# Patient Record
Sex: Female | Born: 1985 | Hispanic: Yes | Marital: Married | Smoking: Never smoker
Health system: Southern US, Community
[De-identification: ages and names within clinical notes are randomized; demographics above are authoritative.]

## PROBLEM LIST (undated history)

## (undated) DIAGNOSIS — K219 Gastro-esophageal reflux disease without esophagitis: Secondary | ICD-10-CM

## (undated) DIAGNOSIS — J479 Bronchiectasis, uncomplicated: Secondary | ICD-10-CM

## (undated) DIAGNOSIS — J45909 Unspecified asthma, uncomplicated: Secondary | ICD-10-CM

## (undated) HISTORY — DX: Bronchiectasis, uncomplicated: J47.9

## (undated) HISTORY — DX: Unspecified asthma, uncomplicated: J45.909

---
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
## 2012-05-24 HISTORY — PX: NASAL SINUS SURGERY: SHX719

## 2013-09-21 HISTORY — PX: CHOLECYSTECTOMY: SHX55

## 2020-11-21 HISTORY — PX: WISDOM TOOTH EXTRACTION: SHX21

## 2021-03-02 ENCOUNTER — Ambulatory Visit: Payer: 59 | Admitting: Family Medicine

## 2021-03-02 ENCOUNTER — Encounter: Payer: Self-pay | Admitting: Family Medicine

## 2021-03-02 ENCOUNTER — Other Ambulatory Visit: Payer: Self-pay

## 2021-03-02 VITALS — BP 92/62 | HR 82 | Temp 98.1°F | Ht 63.0 in | Wt 145.0 lb

## 2021-03-02 DIAGNOSIS — R21 Rash and other nonspecific skin eruption: Secondary | ICD-10-CM

## 2021-03-02 DIAGNOSIS — J309 Allergic rhinitis, unspecified: Secondary | ICD-10-CM

## 2021-03-02 DIAGNOSIS — J479 Bronchiectasis, uncomplicated: Secondary | ICD-10-CM | POA: Diagnosis not present

## 2021-03-02 DIAGNOSIS — N644 Mastodynia: Secondary | ICD-10-CM | POA: Diagnosis not present

## 2021-03-02 DIAGNOSIS — K5900 Constipation, unspecified: Secondary | ICD-10-CM

## 2021-03-02 MED ORDER — ALBUTEROL SULFATE HFA 108 (90 BASE) MCG/ACT IN AERS: 1.0000 | INHALATION_SPRAY | Freq: Four times a day (QID) | RESPIRATORY_TRACT | 1 refills | Status: DC | PRN

## 2021-03-02 MED ORDER — BUDESONIDE-FORMOTEROL FUMARATE 160-4.5 MCG/ACT IN AERO: 2.0000 | INHALATION_SPRAY | Freq: Two times a day (BID) | RESPIRATORY_TRACT | 1 refills | Status: DC

## 2021-03-02 NOTE — Assessment & Plan Note (Signed)
Ongoing left lateral breast pain, sharp/stinging character, denies any skin color changes including redness, no skin texture changes, no nipple discharge.  States that sensation is similar to what she describes as mastitis when she was nursing in the past.  Physical examination reveals symmetric breasts, nipples, no skin discoloration, abnormal texture, there is discrete and focal tenderness along the fourth rib laterally without crepitations, no overlying masses appreciated, no axillary lymphadenopathy, no nipple discharge expressed, contralateral breast is nontender and without masses.  Patient with left described breast pain in the setting of recent progression of chronic cough, no discrete masses, concern for musculoskeletal etiology among other differential entities.  I have advised a short course of ibuprofen 600 mg daily x10 days to assess for response, I have also placed a referral to gynecology for further evaluation and management.

## 2021-03-02 NOTE — Assessment & Plan Note (Signed)
Patient with noted rash at the forehead, chin, cheeks

## 2021-03-02 NOTE — Patient Instructions (Addendum)
-   Continue current inhaler medications - Dose ibuprofen 600 mg once daily with breakfast/lunch x10 days and evaluate breast pain - Start intranasal steroid Flonase (fluticasone propionate) - Over-the-counter antihistamine (Claritin, Zyrtec, etc.) - Increase hydration (water intake) - Review information provided  - Referral coordinator will contact you for follow-up with OB/GYN, dermatology, pulmonology

## 2021-03-03 ENCOUNTER — Telehealth: Payer: Self-pay

## 2021-03-03 NOTE — Telephone Encounter (Signed)
[facility] referring for establish care, left lateral breast pain and tenderness, no masses appreciated. Called and left voicemail for patient to call back to be scheduled.

## 2021-03-04 NOTE — Telephone Encounter (Addendum)
Called and left voicemail for patient to call back to be scheduled. X2  

## 2021-03-05 NOTE — Progress Notes (Signed)
Primary Care / Sports Medicine Office Visit  Patient Information:  Patient ID: Jeanette Sandoval, female DOB: Sep 17, 1985 Age: 35 y.o. MRN: [id number]   Jeanette Sandoval is a pleasant 35 y.o. female presenting with the following:  Chief Complaint  Patient presents with   New Patient (Initial Visit)   Establish Care    Moved from [place] over a year ago    Review of Systems pertinent details above   Patient Active Problem List   Diagnosis Date Noted   Malar rash 03/02/2021   Bronchiectasis without complication (HCC) 03/02/2021   Allergic rhinitis 03/02/2021   Breast pain, left 03/02/2021   Past Medical History:  Diagnosis Date   Asthma    Bronchiectasis [facility])    Outpatient Encounter Medications as of 03/02/2021  Medication Sig   [DISCONTINUED] albuterol (VENTOLIN HFA) 108 (90 Base) MCG/ACT inhaler Inhale 1 puff into the lungs every 6 (six) hours as needed for wheezing or shortness of breath.   [DISCONTINUED] budesonide-formoterol (SYMBICORT) 160-4.5 MCG/ACT inhaler Inhale 2 puffs into the lungs 2 (two) times daily.   albuterol (VENTOLIN HFA) 108 (90 Base) MCG/ACT inhaler Inhale 1 puff into the lungs every 6 (six) hours as needed for wheezing or shortness of breath.   budesonide-formoterol (SYMBICORT) 160-4.5 MCG/ACT inhaler Inhale 2 puffs into the lungs 2 (two) times daily.   No facility-administered encounter medications on file as of 03/02/2021.   Past Surgical History:  Procedure Laterality Date   APPENDECTOMY  1999   CESAREAN SECTION  2019   NASAL SINUS SURGERY  2014   WISDOM TOOTH EXTRACTION Bilateral 11/2020    Vitals:   03/02/21 1616  BP: 92/62  Pulse: 82  Temp: 98.1 F (36.7 C)  SpO2: 96%   Vitals:   03/02/21 1616  Weight: 145 lb (65.8 kg)  Height: 5\' 3"  (1.6 m)   Body mass index is 25.69 kg/m.  No results found.   Independent interpretation of notes and tests performed by another provider:   None  Procedures performed:   None  Pertinent  History, Exam, Impression, and Recommendations:   Breast pain, left Ongoing left lateral breast pain, sharp/stinging character, denies any skin color changes including redness, no skin texture changes, no nipple discharge.  States that sensation is similar to what she describes as mastitis when she was nursing in the past.  Physical examination reveals symmetric breasts, nipples, no skin discoloration, abnormal texture, there is discrete and focal tenderness along the fourth rib laterally without crepitations, no overlying masses appreciated, no axillary lymphadenopathy, no nipple discharge expressed, contralateral breast is nontender and without masses.  Patient with left described breast pain in the setting of recent progression of chronic cough, no discrete masses, concern for musculoskeletal etiology among other differential entities.  I have advised a short course of ibuprofen 600 mg daily x10 days to assess for response, I have also placed a referral to gynecology for further evaluation and management.  Malar rash Patient with noted rash at the forehead, chin, cheeks  Allergic rhinitis Turbinate erythema and swelling noted in the setting of presenting concern over hacking productive cough while lung fields are clear to auscultation throughout without wheezes, rales, rhonchi.  I discussed the concern over postnasal drainage contributing to her stated symptoms and I have advised intranasal steroid, OTC antihistamine.  Bronchiectasis without complication (HCC) Stated history of the same, she has requested pulmonology referral as [place]  She states regular Symbicort and near daily  albuterol usage for bronchiectasis.  Lung examination reveals clear lung fields throughout without wheezes, rales, rhonchi.  Concern over frequency of albuterol usage in the setting of examination findings, referral placed to pulmonology, we will follow peripherally.    Orders & Medications Meds ordered this encounter  Medications   albuterol (VENTOLIN HFA) 108 (90 Base) MCG/ACT inhaler    Sig: Inhale 1 puff into the lungs every 6 (six) hours as needed for wheezing or shortness of breath.    Dispense:  8 g    Refill:  1   budesonide-formoterol (SYMBICORT) 160-4.5 MCG/ACT inhaler    Sig: Inhale 2 puffs into the lungs 2 (two) times daily.    Dispense:  10 g    Refill:  1   Orders Placed This Encounter  Procedures   TSH Rfx on Abnormal to Free T4   ANA 12 Plus Profile (RDL)   CBC with Differential/Platelet   Comprehensive metabolic panel   Ambulatory referral to Gynecology   Ambulatory referral to Pulmonology   Ambulatory referral to Dermatology     Return in about 2 months (around 05/02/2021).     Jerrol Banana, MD   Primary Care Sports Medicine [facility]

## 2021-03-05 NOTE — Assessment & Plan Note (Signed)
Turbinate erythema and swelling noted in the setting of presenting concern over hacking productive cough while lung fields are clear to auscultation throughout without wheezes, rales, rhonchi.  I discussed the concern over postnasal drainage contributing to her stated symptoms and I have advised intranasal steroid, OTC antihistamine.

## 2021-03-05 NOTE — Assessment & Plan Note (Signed)
Stated history of the same, she has requested pulmonology referral as she had previously been [place] one [place].  She states regular Symbicort and near daily albuterol usage for bronchiectasis.  Lung examination reveals clear lung fields throughout without wheezes, rales, rhonchi.  Concern over frequency of albuterol usage in the setting of examination findings, referral placed to pulmonology, we will follow peripherally.

## 2021-03-06 NOTE — Telephone Encounter (Signed)
Patient is scheduled for 03/25/21 with JEG

## 2021-03-19 ENCOUNTER — Encounter: Payer: Self-pay | Admitting: Family Medicine

## 2021-03-25 ENCOUNTER — Ambulatory Visit (INDEPENDENT_AMBULATORY_CARE_PROVIDER_SITE_OTHER): Payer: 59 | Admitting: Advanced Practice Midwife

## 2021-03-25 ENCOUNTER — Encounter: Payer: Self-pay | Admitting: Advanced Practice Midwife

## 2021-03-25 ENCOUNTER — Other Ambulatory Visit: Payer: Self-pay

## 2021-03-25 VITALS — BP 105/67 | Ht 63.0 in | Wt 144.0 lb

## 2021-03-25 DIAGNOSIS — N6325 Unspecified lump in the left breast, overlapping quadrants: Secondary | ICD-10-CM

## 2021-03-25 DIAGNOSIS — N644 Mastodynia: Secondary | ICD-10-CM | POA: Diagnosis not present

## 2021-03-30 ENCOUNTER — Encounter: Payer: Self-pay | Admitting: Advanced Practice Midwife

## 2021-03-30 NOTE — Progress Notes (Signed)
Patient ID: Jeanette Sandoval, female   DOB: 1985/12/23, 35 y.o.   MRN: [id number]  Reason for Consult: Breast Problem (Burning sensation Lt breast not really pain, can't really explain. RM 3)   Referred by Jerrol Banana, MD  Subjective:  Date of Service: 03/25/2021  HPI:  Jeanette Sandoval is a 35 y.o. female being seen on referral for pain in her left breast. The pain began about 2 months ago and is in approximately the same place as a clogged duct that she had when breastfeeding about a year ago. She stopped breastfeeding in January of this year. She has the pain most days. It comes and goes and can last for hours at a time. It is burning and stinging. She denies redness, heat, lump or hard area. She has not done anything that makes the pain better or worse. She has irregular periods from every 20 to 60 days of moderate flow. She uses condoms for birth control.   Past Medical History:  Diagnosis Date   Asthma    Bronchiectasis (HCC)    Family History  Problem Relation Age of Onset   Lupus Mother    Stomach cancer Maternal Aunt    Ovarian cancer Maternal Aunt    Coronary artery disease Maternal Grandmother    Peripheral Artery Disease Maternal Grandmother    Liver cancer Paternal Grandmother    Past Surgical History:  Procedure Laterality Date   APPENDECTOMY  1999   CESAREAN SECTION  2019   NASAL SINUS SURGERY  2014   WISDOM TOOTH EXTRACTION Bilateral 11/2020    Short Social History:  Social History   Tobacco Use   Smoking status: Never   Smokeless tobacco: Never  Substance Use Topics   Alcohol use: Not Currently    No Known Allergies  Current Outpatient Medications  Medication Sig Dispense Refill   albuterol (VENTOLIN HFA) 108 (90 Base) MCG/ACT inhaler Inhale 1 puff into the lungs every 6 (six) hours as needed for wheezing or shortness of breath. 8 g 1   budesonide-formoterol (SYMBICORT) 160-4.5 MCG/ACT inhaler Inhale 2 puffs into the lungs 2 (two) times daily. 10  g 1   doxycycline (VIBRAMYCIN) 100 MG capsule Take 100 mg by mouth 2 (two) times daily.     No current facility-administered medications for this visit.    Review of Systems  Constitutional:  Negative for chills and fever.  HENT:  Negative for congestion, ear discharge, ear pain, hearing loss, sinus pain and sore throat.   Eyes:  Negative for blurred vision and double vision.  Respiratory:  Negative for cough, shortness of breath and wheezing.   Cardiovascular:  Negative for chest pain, palpitations and leg swelling.  Gastrointestinal:  Negative for abdominal pain, blood in stool, constipation, diarrhea, heartburn, melena, nausea and vomiting.  Genitourinary:  Negative for dysuria, flank pain, frequency, hematuria and urgency.  Musculoskeletal:  Negative for back pain, joint pain and myalgias.  Skin:  Negative for itching and rash.  Neurological:  Negative for dizziness, tingling, tremors, sensory change, speech change, focal weakness, seizures, loss of consciousness, weakness and headaches.  Endo/Heme/Allergies:  Negative for environmental allergies. Does not bruise/bleed easily.  Psychiatric/Behavioral:  Negative for depression, hallucinations, memory loss, substance abuse and suicidal ideas. The patient is not nervous/anxious and does not have insomnia.   Breast: positive for painful area     Objective:  Objective   Vitals:   03/25/21 1536  BP: 105/67  Weight: 144 lb (65.3 kg)  Height: 5\' 3"  (1.6  m)   Body mass index is 25.51 kg/m. Constitutional: Well nourished, well developed female in no acute distress.  HEENT: normal Skin: Warm and dry.  Cardiovascular: Regular rate and rhythm.   Extremity:  no edema   Respiratory: Clear to auscultation bilateral. Normal respiratory effort Breast: breasts symmetrical, right breast non-tender, no masses or lumps, left breast with focal area of tenderness at 3 o'clock approximately 3 cm from nipple with ropey lump that could be fibrocystic  tissue Neuro: DTRs 2+, Cranial nerves grossly intact Psych: Alert and Oriented x3. No memory deficits. Normal mood and affect.     Assessment/Plan:     35 y.o. G1 P56 female with painful left breast lump normal breast tissue vs other type of mass  Diagnostic mammogram and ultrasound ordered  Follow up as needed after imaging   [facility] 03/30/2021, 9:04 AM

## 2021-03-31 ENCOUNTER — Ambulatory Visit: Payer: 59 | Admitting: Internal Medicine

## 2021-03-31 ENCOUNTER — Other Ambulatory Visit: Payer: Self-pay

## 2021-03-31 ENCOUNTER — Encounter: Payer: Self-pay | Admitting: Internal Medicine

## 2021-03-31 ENCOUNTER — Other Ambulatory Visit
Admission: RE | Admit: 2021-03-31 | Discharge: 2021-03-31 | Disposition: A | Discharge status: Home / Self Care | Payer: 59 | Source: Home / Self Care | Attending: Internal Medicine | Admitting: Internal Medicine

## 2021-03-31 ENCOUNTER — Ambulatory Visit
Admission: RE | Admit: 2021-03-31 | Discharge: 2021-03-31 | Disposition: A | Discharge status: Home / Self Care | Payer: 59 | Source: Ambulatory Visit | Attending: Internal Medicine | Admitting: Internal Medicine

## 2021-03-31 ENCOUNTER — Ambulatory Visit
Admission: RE | Admit: 2021-03-31 | Discharge: 2021-03-31 | Disposition: A | Discharge status: Home / Self Care | Payer: 59 | Attending: Internal Medicine | Admitting: Internal Medicine

## 2021-03-31 DIAGNOSIS — J479 Bronchiectasis, uncomplicated: Secondary | ICD-10-CM

## 2021-03-31 MED ORDER — BUDESONIDE-FORMOTEROL FUMARATE 80-4.5 MCG/ACT IN AERO
INHALATION_SPRAY | RESPIRATORY_TRACT | 12 refills | Status: DC
Start: 2021-03-31 — End: 2021-05-11

## 2021-03-31 MED ORDER — PANTOPRAZOLE SODIUM 40 MG PO TBEC: 40.0000 mg | DELAYED_RELEASE_TABLET | Freq: Every day | ORAL | 2 refills | Status: DC

## 2021-03-31 MED ORDER — FAMOTIDINE 20 MG PO TABS: ORAL_TABLET | ORAL | 11 refills | Status: DC

## 2021-03-31 NOTE — Patient Instructions (Addendum)
Pantoprazole (protonix) 40 mg   Take  30-60 min before first meal of the day and Pepcid (famotidine)  20 mg after supper until return to office - this is the best way to tell whether stomach acid is contributing to your problem.    GERD (REFLUX)  is an extremely common cause of respiratory symptoms just like yours , many times with no obvious heartburn at all.    It can be treated with medication, but also with lifestyle changes including elevation of the head of your bed (ideally with 6 -8inch blocks under the headboard of your bed),  Smoking cessation, avoidance of late meals, excessive alcohol, and avoid fatty foods, chocolate, peppermint, colas, red wine, and acidic juices such as orange juice.  NO MINT OR MENTHOL PRODUCTS SO NO COUGH DROPS  USE SUGARLESS CANDY INSTEAD (Jolley ranchers or Stover's or Life Savers) or even ice chips will also do - the key is to swallow to prevent all throat clearing. NO OIL BASED VITAMINS - use powdered substitutes.  Avoid fish oil when coughing.   Plan A = Automatic = Always=    Symbicort 80 Take 2 puffs first thing in am and then another 2 puffs about 12 hours later.   Work on inhaler technique:  relax and gently blow all the way out then take a nice smooth full deep breath back in, triggering the inhaler at same time you start breathing in.  Hold for up to 5 seconds if you can. Blow out thru nose. Rinse and gargle with water when done.  If mouth or throat bother you at all,  try brushing teeth/gums/tongue with arm and hammer toothpaste/ make a slurry and gargle and spit out.      Plan B = Backup (to supplement plan A, not to replace it) Only use your albuterol inhaler as a rescue medication to be used if you can't catch your breath by resting or doing a relaxed purse lip breathing pattern.  - The less you use it, the better it will work when you need it. - Ok to use the inhaler up to 2 puffs  every 4 hours if you must but call for appointment if use goes up  over your usual need - Don't leave home without it !!  (think of it like the spare tire for your car)     For cough > mucinex dm 1200 mg every 12 hours every 12 hoursas needed    Please remember to go to the lab and x-ray department  for your tests - we will call you with the results when they are available.        Please schedule a follow up office visit in 4 weeks, sooner if needed with pfts

## 2021-03-31 NOTE — Progress Notes (Signed)
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   Sleeping as above without nocturnal  or early am exacerbation  of respiratory  c/o's or need for noct saba. Also denies any obvious fluctuation of symptoms with weather or environmental changes or other aggravating or alleviating factors except as outlined above   No unusual exposure hx or h/o childhood pna/ asthma or knowledge of premature birth.  Current Allergies, Complete Past Medical History, Past Surgical History, Family History, and Social History were reviewed in [employer] record.  ROS  The following are not active complaints unless bolded Hoarseness, sore throat, dysphagia, dental problems, itching, sneezing,  nasal congestion or discharge of excess mucus or purulent secretions, ear ache,   fever, chills, sweats, unintended wt loss or wt gain, classically pleuritic or exertional cp,  orthopnea pnd or arm/hand swelling  or leg swelling, presyncope, palpitations, abdominal pain, anorexia, nausea, vomiting, diarrhea  or change in bowel habits or change in bladder habits, change in stools or change in urine,  dysuria, hematuria,  rash, arthralgias, visual complaints, headache, numbness, weakness or ataxia or problems with walking or coordination,  change in mood or  memory.           Past Medical History:  Diagnosis Date   Asthma    Bronchiectasis ([place])     Outpatient Medications Prior to Visit  Medication Sig Dispense Refill   albuterol (VENTOLIN HFA) 108 (90 Base) MCG/ACT inhaler Inhale 1 puff into the lungs every 6 (six) hours as needed for wheezing or shortness of breath. 8 g 1   budesonide-formoterol (SYMBICORT) 160-4.5 MCG/ACT inhaler Inhale 2 puffs into the lungs 2 (two) times daily. 10 g 1   doxycycline (VIBRAMYCIN) 100 MG capsule Take 100 mg by mouth 2 (two) times daily.     No facility-administered medications prior to visit.     Objective:     BP 120/80 (BP Location: Left Arm, Patient Position: Sitting, Cuff Size: Normal)   Pulse 72   Temp (!) 97.3 F (36.3 C) (Oral)   Ht 5' 5.35" (1.66 m)   Wt 147 lb 9.6 oz (67 kg)   LMP 03/12/2021   SpO2 98%   BMI 24.30 kg/m   SpO2: 98 %  Amb healthy appearing latina /nad    HEENT : Oropharynx  clear   Nasal turbinates nl    NECK :  without  apparent JVD/ palpable Nodes/TM    LUNGS: no acc muscle use,  Nl contour chest with pan exp rhonchi  bilaterally without cough on insp or exp maneuvers   CV:  RRR  no s3 or murmur or increase in P2, and no edema   ABD:  soft and nontender with nl inspiratory excursion in the supine position. No bruits or organomegaly appreciated   MS:  Nl gait/ ext warm without deformities Or obvious joint restrictions  calf tenderness, cyanosis or clubbing    SKIN: warm and dry without lesions    NEURO:  alert, approp, nl sensorium with  no motor or cerebellar deficits apparent.    CXR PA and Lateral:   03/31/2021 :    I personally reviewed images and agree with radiology impression as follows:    1. Patchy airspace consolidation within the right middle lobe compatible with pneumonia.  Radiographic follow-up after appropriate treatment is recommended to document resolution. 2. Rounded density projecting over the left lung base may reflect a nipple shadow. Attention on follow-up.     Assessment   Bronchiectasis without complication ([place]) 67/54/49  allergy profile Eos 0.2 / IgE 24   alpha one AT phenotype  MM  Level 143  - 04/07/21 Quant Ig's >>> not done  - 04/07/21 Quant Tb  neg  - 04/07/21  ESR  =  32   She will need longitudinal care as well as extensive records from [place] in [place] so I have referred her to Dr Patsey Berthold in our office here to complete the w/u  - in meantime add empirical gerd rx and f/u with HRCT planned       Jeanette Gully, MD 03/31/2021

## 2021-04-01 ENCOUNTER — Telehealth: Payer: Self-pay | Admitting: Internal Medicine

## 2021-04-01 NOTE — Telephone Encounter (Signed)
I spoke with [facility] and let her know that we did receive the report for the CXR. Nothing further needed.

## 2021-04-07 ENCOUNTER — Telehealth: Payer: Self-pay | Admitting: Internal Medicine

## 2021-04-07 ENCOUNTER — Other Ambulatory Visit
Admission: RE | Admit: 2021-04-07 | Discharge: 2021-04-07 | Disposition: A | Discharge status: Home / Self Care | Payer: 59 | Attending: Internal Medicine | Admitting: Internal Medicine

## 2021-04-07 DIAGNOSIS — J479 Bronchiectasis, uncomplicated: Secondary | ICD-10-CM | POA: Insufficient documentation

## 2021-04-07 LAB — SEDIMENTATION RATE: Sed Rate: 32 mm/hr — ABNORMAL HIGH (ref 0–20)

## 2021-04-07 LAB — CBC WITH DIFFERENTIAL/PLATELET
Abs Immature Granulocytes: 0.04 10*3/uL (ref 0.00–0.07)
Basophils Absolute: 0 10*3/uL (ref 0.0–0.1)
Basophils Relative: 0 %
Eosinophils Absolute: 0.2 10*3/uL (ref 0.0–0.5)
Eosinophils Relative: 2 %
HCT: 39.7 % (ref 36.0–46.0)
Hemoglobin: 13.1 g/dL (ref 12.0–15.0)
Immature Granulocytes: 0 %
Lymphocytes Relative: 19 %
Lymphs Abs: 2.1 10*3/uL (ref 0.7–4.0)
MCH: 29.2 pg (ref 26.0–34.0)
MCHC: 33 g/dL (ref 30.0–36.0)
MCV: 88.4 fL (ref 80.0–100.0)
Monocytes Absolute: 0.6 10*3/uL (ref 0.1–1.0)
Monocytes Relative: 6 %
Neutro Abs: 8 10*3/uL — ABNORMAL HIGH (ref 1.7–7.7)
Neutrophils Relative %: 73 %
Platelets: 275 10*3/uL (ref 150–400)
RBC: 4.49 MIL/uL (ref 3.87–5.11)
RDW: 14 % (ref 11.5–15.5)
WBC: 10.9 10*3/uL — ABNORMAL HIGH (ref 4.0–10.5)
nRBC: 0 % (ref 0.0–0.2)

## 2021-04-07 LAB — TSH: TSH: 1.068 u[IU]/mL (ref 0.350–4.500)

## 2021-04-07 NOTE — Telephone Encounter (Signed)
Dr. Sherene Sires please advise on patient mychart message. Thank you    I don't know if this is any help. This is the results I got January 2021. They are in Spanish, though.   SEE ATTACHMENT

## 2021-04-07 NOTE — Telephone Encounter (Signed)
This is the reason I referred her to Dr Jayme Cloud  - this is nothing urgent but the doctor who assumes her care will have to get familiar with her prior w/u which is all in spanish and this can wait until Dr Reece Agar is available for outpt consultation unless new significant symptoms in meantime in which case we can have her seen more urgently

## 2021-04-08 ENCOUNTER — Other Ambulatory Visit: Payer: Self-pay

## 2021-04-08 DIAGNOSIS — J849 Interstitial pulmonary disease, unspecified: Secondary | ICD-10-CM

## 2021-04-08 NOTE — Telephone Encounter (Signed)
Call patient :  cxr is abnormal and c/w bronchiectasis but since we don't have the comparison study it's hard to know what to do here.  I would strongly rec she see Dr Jayme Cloud next available with all her records and scans from [place] to sort out the next step.  If the last CT was over 6 months ago it would be reasonable to go ahead with HRCT s contrast now for dx of Bronchiectasis > if more recent then hold off until Dr Reece Agar can review them    I have called and LM on VM for the pt to call us back.

## 2021-04-08 NOTE — Telephone Encounter (Signed)
Called and spoke to patient. She voiced understanding. Scheduled her for 05/11/21 in [place] with Dr.Gonzalez. Offered sooner appt but was unable to do anything sooner than the 19th. Patient stated last CT scan was done in 2021. Will place a HRCT s contrast and patient is aware. Pt is also aware to bring any scans and records from [place] to appt with Dr. Jayme Cloud.

## 2021-04-09 LAB — ALPHA-1-ANTITRYPSIN PHENOTYP: A-1 Antitrypsin, Ser: 143 mg/dL (ref 100–188)

## 2021-04-12 LAB — QUANTIFERON-TB GOLD PLUS (RQFGPL)
QuantiFERON Mitogen Value: >10 IU/mL
QuantiFERON Nil Value: 0.03 IU/mL
QuantiFERON TB1 Ag Value: 0.04 IU/mL
QuantiFERON TB2 Ag Value: 0.02 IU/mL

## 2021-04-12 LAB — QUANTIFERON-TB GOLD PLUS: QuantiFERON-TB Gold Plus: NEGATIVE

## 2021-04-12 LAB — IGE: IgE (Immunoglobulin E), Serum: 24 IU/mL (ref 6–495)

## 2021-04-13 NOTE — Progress Notes (Signed)
I called and spoke with the pt and notified of results. OV with MW cancelled since she is seeing Dr Alveta Heimlich on 05/11/21.

## 2021-04-21 ENCOUNTER — Telehealth: Payer: Self-pay

## 2021-04-21 NOTE — Telephone Encounter (Addendum)
Called and LVM in regards to upcoming covid test on Thursday 12/1, patient did not answer. Will route to triage for follow up.

## 2021-04-22 NOTE — Telephone Encounter (Signed)
Lm x2 for patient.  Will close encounter per office protocol.   

## 2021-04-23 ENCOUNTER — Other Ambulatory Visit: Payer: Self-pay

## 2021-04-23 ENCOUNTER — Other Ambulatory Visit
Admission: RE | Admit: 2021-04-23 | Discharge: 2021-04-23 | Disposition: A | Discharge status: Home / Self Care | Payer: 59 | Source: Ambulatory Visit | Attending: Internal Medicine | Admitting: Internal Medicine

## 2021-04-23 DIAGNOSIS — Z20822 Contact with and (suspected) exposure to covid-19: Secondary | ICD-10-CM | POA: Diagnosis not present

## 2021-04-23 DIAGNOSIS — Z01812 Encounter for preprocedural laboratory examination: Secondary | ICD-10-CM | POA: Diagnosis present

## 2021-04-24 ENCOUNTER — Ambulatory Visit: Payer: 59 | Attending: Internal Medicine

## 2021-04-24 DIAGNOSIS — J479 Bronchiectasis, uncomplicated: Secondary | ICD-10-CM | POA: Diagnosis present

## 2021-04-24 DIAGNOSIS — Z79899 Other long term (current) drug therapy: Secondary | ICD-10-CM | POA: Diagnosis not present

## 2021-04-24 LAB — PULMONARY FUNCTION TEST ARMC ONLY
DL/VA % pred: 109 %
DL/VA: 4.94 ml/min/mmHg/L
DLCO unc % pred: 105 %
DLCO unc: 24.35 ml/min/mmHg
FEF 25-75 Post: 2.44 L/sec
FEF 25-75 Pre: 2.11 L/sec
FEF2575-%Change-Post: 15 %
FEF2575-%Pred-Post: 66 %
FEF2575-%Pred-Pre: 57 %
FEV1-%Change-Post: 4 %
FEV1-%Pred-Post: 81 %
FEV1-%Pred-Pre: 77 %
FEV1-Post: 2.66 L
FEV1-Pre: 2.54 L
FEV1FVC-%Change-Post: 4 %
FEV1FVC-%Pred-Pre: 88 %
FEV6-%Change-Post: 0 %
FEV6-%Pred-Post: 88 %
FEV6-%Pred-Pre: 87 %
FEV6-Post: 3.36 L
FEV6-Pre: 3.33 L
FEV6FVC-%Pred-Post: 101 %
FEV6FVC-%Pred-Pre: 101 %
FVC-%Change-Post: 0 %
FVC-%Pred-Post: 87 %
FVC-%Pred-Pre: 86 %
FVC-Post: 3.36 L
FVC-Pre: 3.36 L
Post FEV1/FVC ratio: 79 %
Post FEV6/FVC ratio: 100 %
Pre FEV1/FVC ratio: 76 %
Pre FEV6/FVC Ratio: 100 %
RV % pred: 107 %
RV: 1.58 L
TLC % pred: 93 %
TLC: 4.87 L

## 2021-04-24 LAB — SARS CORONAVIRUS 2 (TAT 6-24 HRS): SARS Coronavirus 2: NEGATIVE

## 2021-04-24 MED ORDER — ALBUTEROL SULFATE (2.5 MG/3ML) 0.083% IN NEBU
2.5000 mg | INHALATION_SOLUTION | Freq: Once | RESPIRATORY_TRACT | Status: AC
  Administered 2021-04-24: 2.5 mg via RESPIRATORY_TRACT
  Filled 2021-04-24: qty 3

## 2021-04-28 ENCOUNTER — Ambulatory Visit: Payer: 59 | Admitting: Internal Medicine

## 2021-05-04 ENCOUNTER — Ambulatory Visit: Payer: 59 | Admitting: Family Medicine

## 2021-05-06 ENCOUNTER — Other Ambulatory Visit: Payer: Self-pay

## 2021-05-06 ENCOUNTER — Ambulatory Visit
Admission: RE | Admit: 2021-05-06 | Discharge: 2021-05-06 | Disposition: A | Discharge status: Home / Self Care | Payer: 59 | Source: Ambulatory Visit | Attending: Internal Medicine | Admitting: Internal Medicine

## 2021-05-06 DIAGNOSIS — J849 Interstitial pulmonary disease, unspecified: Secondary | ICD-10-CM | POA: Diagnosis present

## 2021-05-08 NOTE — Progress Notes (Signed)
Noted, patient has appointment with me on 19 December.

## 2021-05-11 ENCOUNTER — Encounter: Payer: Self-pay | Admitting: Pulmonary Disease

## 2021-05-11 ENCOUNTER — Other Ambulatory Visit: Payer: Self-pay

## 2021-05-11 ENCOUNTER — Other Ambulatory Visit
Admission: RE | Admit: 2021-05-11 | Discharge: 2021-05-11 | Disposition: A | Discharge status: Home / Self Care | Payer: 59 | Source: Ambulatory Visit | Attending: Pulmonary Disease | Admitting: Pulmonary Disease

## 2021-05-11 ENCOUNTER — Ambulatory Visit: Payer: 59 | Admitting: Pulmonary Disease

## 2021-05-11 DIAGNOSIS — J4540 Moderate persistent asthma, uncomplicated: Secondary | ICD-10-CM | POA: Diagnosis not present

## 2021-05-11 DIAGNOSIS — J479 Bronchiectasis, uncomplicated: Secondary | ICD-10-CM | POA: Diagnosis not present

## 2021-05-11 MED ORDER — BUDESONIDE-FORMOTEROL FUMARATE 160-4.5 MCG/ACT IN AERO: 2.0000 | INHALATION_SPRAY | Freq: Two times a day (BID) | RESPIRATORY_TRACT | 11 refills | Status: DC

## 2021-05-11 NOTE — Progress Notes (Signed)
Subjective:    Patient ID: Jeanette Sandoval, female    DOB: 27-Feb-1986, 35 y.o.   MRN: [id number] Patient Care Team: Montel Culver, MD as PCP - General [facility])  Chief Complaint  Patient presents with   Follow-up    Mild Sob with exertion and prod cough with to clear to green sputum.     HPI Jeanette Sandoval is a 35 year old woman, native of [place], lifelong never smoker who presents for follow-up on chronic respiratory problems previously evaluated in her native [place].  She was initially evaluated by Dr. Christinia Gully on 31 March 2021.  She brought copies of her records from [place], Dr. Melvyn Novas was unable to review the records due to language barrier so she was assigned to me for further management.  I have reviewed the available records.  She had a an extensive work-up in [place] that included CT chest and bronchoscopy.  CT chest showed bronchiectasis in both the right middle lobe and lingula and micronodules.  Bronchoscopy was performed on 09 July 2019 and there was evaluation for MTB and MAI none of which none were detected.  Brushings performed at that time were consistent just with nonspecific inflammation.  There were no overt abnormalities noted on gross examination of the airway per report.  Patient also had sinus films performed which showed chronic inflammation she had immunoglobulins performed which were normal.  She had a sweat chloride test which was normal there was no evidence of eosinophilia on CBC.  IgE was normal.  I have requested that these records to be scanned in.  Today the patient does not endorse any new complaint from her baseline.  She states that she has chronic dry cough occasionally produces sputum which is difficult to expectorate.  She has tried various clearance devices which have not been helpful.  She is currently on Symbicort 80/4.5, 2 inhalations daily.  She does not note significant improvement in her symptoms with this.  She has not had any  hemoptysis of late though sometimes in the past has produced streaky hemoptysis.  She notes that on level ground she can walk at normal pace but cannot hurry or go up hills or steps without feeling short of breath.  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  No history of smoking or exposure to secondhand smoke.  No exotic pets nor birds at home.  Tubs in the home.   Review of Systems A 10 point review of systems was performed and it is as noted above otherwise negative.  Patient Active Problem List   Diagnosis Date Noted   Malar rash 03/02/2021   Bronchiectasis without complication ([place]) 99991111   Allergic rhinitis 03/02/2021   Breast pain, left 03/02/2021   Social History   Tobacco Use   Smoking status: Never   Smokeless tobacco: Never  Substance Use Topics   Alcohol use: Not Currently   No Known Allergies Current Meds  Medication Sig   albuterol (VENTOLIN HFA) 108 (90 Base) MCG/ACT inhaler Inhale 1 puff into the lungs every 6 (six) hours as needed for wheezing or shortness of breath.   budesonide-formoterol (SYMBICORT) 160-4.5 MCG/ACT inhaler Inhale 2 puffs into the lungs 2 (two) times daily.   doxycycline (VIBRAMYCIN) 100 MG capsule Take 100 mg by mouth 2 (two) times daily.   famotidine (PEPCID) 20 MG tablet One after supper   pantoprazole (PROTONIX) 40 MG tablet Take 1 tablet (40 mg total) by mouth daily.  Take 30-60 min before first meal of the day   [DISCONTINUED] budesonide-formoterol (SYMBICORT) 80-4.5 MCG/ACT inhaler Take 2 puffs first thing in am and then another 2 puffs about 12 hours later.   Immunization History  Administered Date(s) Administered   Influenza-Unspecified 02/25/2021       Objective:   Physical Exam BP 126/74 (BP Location: Left Arm, Cuff Size: Normal)    Pulse 83    Temp 97.8 F (36.6 C) (Temporal)    Ht 5' 5.35" (1.66 m)    Wt 148 lb 3.2 oz (67.2 kg)    LMP 04/12/2021    SpO2 99%    BMI 24.40 kg/m    GENERAL: Well-developed, well-nourished woman, no acute distress.  Fully ambulatory.  No conversational dyspnea HEAD: Normocephalic, atraumatic.  EYES: Pupils equal, round, reactive to light.  No scleral icterus.  MOUTH: Nose/mouth/throat not examined due to masking requirements for COVID 19. NECK: Supple. No thyromegaly. Trachea midline. No JVD.  No adenopathy. PULMONARY: Good air entry bilaterally.  There are scattered rhonchi throughout particularly at the lower lung zones.   CARDIOVASCULAR: S1 and S2. Regular rate and rhythm.  No rubs, murmurs or gallops heard. ABDOMEN: Benign. MUSCULOSKELETAL: No joint deformity, no clubbing, no edema.  NEUROLOGIC: No overt focal deficit, no gait disturbance, speech is fluent. SKIN: Intact,warm,dry. PSYCH: Mood and behavior normal  Chest x-ray obtained 31 March 2021, independently reviewed:     Presented to slice from CT high-resolution, obtain 06 May 2021, independently reviewed:  Assessment & Plan:    Orders Placed This Encounter  Procedures   Aspergillus IgE Panel    Standing Status:   Future    Number of Occurrences:   1    Standing Expiration Date:   05/11/2022   Rheumatoid factor    Standing Status:   Future    Number of Occurrences:   1    Standing Expiration Date:   05/11/2022   Sjogren's syndrome antibods(ssa + ssb)    Standing Status:   Future    Number of Occurrences:   1    Standing Expiration Date:   05/11/2022   Immunoglobulins, QN, A/E/G/M    Standing Status:   Future    Number of Occurrences:   1    Standing Expiration Date:   05/11/2022   Meds ordered this encounter  Medications   budesonide-formoterol (SYMBICORT) 160-4.5 MCG/ACT inhaler    Sig: Inhale 2 puffs into the lungs 2 (two) times daily.    Dispense:  1 each    Refill:  11   Patient has areas of bronchiectasis as noted above.  She has had bronchoscopy that has excluded Mycobacterium avium-intracellulare and MTB.  Will obtain Aspergillus titers  and connective tissue screen as well as other serologies as noted above.    The patient was given instruction on obtaining a flutter valve device.  She was given several choices to choose from that she will obtain on her own.  I also recommended the Pulmonica harmonica as this may help with mucociliary clearance.  We increase the strength on the Symbicort from the 80 mcg ICS dose to 160 mcg ICS dose.  She will use 2 puffs twice a day.  We will see her in follow-up in 6 to 8 weeks time she is to contact us prior to that time should any new difficulties arise.   We will see the patient in follow-up in 2 months time she is to contact us prior to that time should any new  difficulties arise.   Gailen Shelter, MD Advanced Bronchoscopy PCCM [facility]    *This note was dictated using voice recognition software/Dragon.  Despite best efforts to proofread, errors can occur which can change the meaning. Any transcriptional errors that result from this process are unintentional and may not be fully corrected at the time of dictation.

## 2021-05-11 NOTE — Patient Instructions (Signed)
We are going to increase your Symbicort to the 160 size that has more anti-inflammatory on it.  This will be 2 puffs twice a day make sure you rinse your mouth well after you use it.  I recommend that you get a flutter valve several brands are available: Acapella Choice flutter valve, Airphysio natural breathing, Aerobika and the [employer].  You can reach these on the web there is a site called [employer] that has these and you can look at them.  The Pulmonica has its own website they all vary in price.  What ever device you decide on make sure you get a size that is fit for adults.  We have ordered some blood work.  See him in follow-up in 6 to 8 weeks time.

## 2021-05-12 LAB — SJOGREN'S SYNDROME ANTIBODS(SSA + SSB)
SSA (Ro) (ENA) Antibody, IgG: <0.2 AI (ref 0.0–0.9)
SSB (La) (ENA) Antibody, IgG: <0.2 AI (ref 0.0–0.9)

## 2021-05-12 LAB — RHEUMATOID FACTOR: Rheumatoid fact SerPl-aCnc: <10 IU/mL (ref <14.0)

## 2021-05-14 ENCOUNTER — Ambulatory Visit: Payer: 59 | Admitting: Family Medicine

## 2021-05-14 LAB — IMMUNOGLOBULINS A/E/G/M, SERUM
IgA: 348 mg/dL (ref 87–352)
IgE (Immunoglobulin E), Serum: 25 IU/mL (ref 6–495)
IgG (Immunoglobin G), Serum: 1552 mg/dL (ref 586–1602)
IgM (Immunoglobulin M), Srm: 177 mg/dL (ref 26–217)

## 2021-05-20 ENCOUNTER — Inpatient Hospital Stay: Admission: RE | Admit: 2021-05-20 | Payer: 59 | Source: Ambulatory Visit

## 2021-05-20 ENCOUNTER — Other Ambulatory Visit: Payer: 59

## 2021-06-25 ENCOUNTER — Other Ambulatory Visit: Payer: Self-pay

## 2021-06-25 ENCOUNTER — Other Ambulatory Visit
Admission: RE | Admit: 2021-06-25 | Discharge: 2021-06-25 | Disposition: A | Discharge status: Home / Self Care | Payer: 59 | Source: Ambulatory Visit | Attending: Pulmonary Disease | Admitting: Pulmonary Disease

## 2021-06-25 ENCOUNTER — Encounter: Payer: Self-pay | Admitting: Pulmonary Disease

## 2021-06-25 ENCOUNTER — Ambulatory Visit: Payer: 59 | Admitting: Pulmonary Disease

## 2021-06-25 VITALS — BP 102/70 | HR 89 | Temp 97.3°F | Ht 65.35 in | Wt 148.6 lb

## 2021-06-25 DIAGNOSIS — J479 Bronchiectasis, uncomplicated: Secondary | ICD-10-CM

## 2021-06-25 DIAGNOSIS — J4540 Moderate persistent asthma, uncomplicated: Secondary | ICD-10-CM | POA: Diagnosis not present

## 2021-06-25 DIAGNOSIS — K219 Gastro-esophageal reflux disease without esophagitis: Secondary | ICD-10-CM

## 2021-06-25 DIAGNOSIS — R053 Chronic cough: Secondary | ICD-10-CM

## 2021-06-25 MED ORDER — PANTOPRAZOLE SODIUM 40 MG PO TBEC: 40.0000 mg | DELAYED_RELEASE_TABLET | Freq: Every day | ORAL | 3 refills | Status: DC

## 2021-06-25 MED ORDER — MONTELUKAST SODIUM 10 MG PO TABS: ORAL_TABLET | ORAL | 2 refills | Status: DC

## 2021-06-25 NOTE — Progress Notes (Signed)
Subjective:    Patient ID: Jeanette Sandoval, female    DOB: [birth year], 36 y.o.   MRN: [id number]  Patient Care Team: Montel Culver, MD as PCP - General (Family Medicine)  Chief Complaint  Patient presents with   Follow-up   HPI Patient is a 36 year old lifelong never smoker, native of [place], who follows here for the issue of bronchiectasis.  This is a scheduled visit.  Her initial visit with me was on 11 May 2021 for the details of that visit please refer to that note.  Since that visit she continues to have some issues with dry cough with occasional tenacious sputum production.  She is using a flutter valve however this is not helping in clearing sputum.  She also has had some issues with gastroesophageal reflux which she noted is helped by Protonix previously prescribed by Dr. Melvyn Novas.  Requests refill.  Feels Symbicort 160 helps her some.  Not using albuterol much she still has issues with breathlessness on exertion.  Issues clearing secretions as noted above.  Flutter valve not helping much.  PFTs on 24 April 2021 showed FEV1 of 2.54 L or 77% predicted, FVC of 3.36 L or 86% predicted, FEV1/FVC 76%, no significant bronchodilator response.  Diffusion capacity normal.  FEF 2575% reduced, test consistent with mild airway obstruction.  Laboratory data obtained at prior visit to include total serum IgE, Aspergillus IgE, immunoglobulins, rheumatoid factor and anti-SSA SSB were negative.  Review of Systems A 10 point review of systems was performed and it is as noted above otherwise negative.  Patient Active Problem List   Diagnosis Date Noted   Malar rash 03/02/2021   Bronchiectasis without complication ([place]) 99991111   Allergic rhinitis 03/02/2021   Breast pain, left 03/02/2021   Social History   Tobacco Use   Smoking status: Never   Smokeless tobacco: Never  Substance Use Topics   Alcohol use: Not Currently   No Known Allergies  Current Meds  Medication Sig    budesonide-formoterol (SYMBICORT) 160-4.5 MCG/ACT inhaler Inhale 2 puffs into the lungs 2 (two) times daily.   pantoprazole (PROTONIX) 40 MG tablet Take 1 tablet (40 mg total) by mouth daily. Take 30-60 min before first meal of the day   pantoprazole (PROTONIX) 40 MG tablet Take 1 tablet (40 mg total) by mouth daily.   [DISCONTINUED] albuterol (VENTOLIN HFA) 108 (90 Base) MCG/ACT inhaler Inhale 1 puff into the lungs every 6 (six) hours as needed for wheezing or shortness of breath.   [DISCONTINUED] doxycycline (VIBRAMYCIN) 100 MG capsule Take 100 mg by mouth 2 (two) times daily.   [DISCONTINUED] famotidine (PEPCID) 20 MG tablet One after supper   Immunization History  Administered Date(s) Administered   Influenza-Unspecified 02/25/2021      Objective:   Physical Exam BP 102/70 (BP Location: Left Arm, Patient Position: Sitting, Cuff Size: Normal)    Pulse 89    Temp (!) 97.3 F (36.3 C) (Oral)    Ht 5' 5.35" (1.66 m)    Wt 148 lb 9.6 oz (67.4 kg)    SpO2 97%    BMI 24.46 kg/m  GENERAL: Well-developed, well-nourished woman, no acute distress.  Fully ambulatory.  No conversational dyspnea HEAD: Normocephalic, atraumatic.  EYES: Pupils equal, round, reactive to light.  No scleral icterus.  MOUTH: Nose/mouth/throat not examined due to masking requirements for COVID 19. NECK: Supple. No thyromegaly. Trachea midline. No JVD.  No adenopathy. PULMONARY: Good air entry bilaterally.  There are scattered rhonchi throughout particularly at  the lower lung zones.   CARDIOVASCULAR: S1 and S2. Regular rate and rhythm.  No rubs, murmurs or gallops heard. ABDOMEN: Benign. MUSCULOSKELETAL: No joint deformity, no clubbing, no edema.  NEUROLOGIC: No overt focal deficit, no gait disturbance, speech is fluent. SKIN: Intact,warm,dry. PSYCH: Mood and behavior normal     Assessment & Plan:     ICD-10-CM   1. Bronchiectasis without complication ([place])  A999333 ANCA Profile    AFB culture with smear    AMB  REFERRAL FOR DME   We will obtain ANCA panel Flutter valve is not effective Will obtain Smart Vest    2. Moderate persistent asthma without complication  123456    Continue Symbicort 160/4.5, 2 inhalations twice a day Continue as needed albuterol Added montelukast    3. GERD without esophagitis  K21.9    Continue pantoprazole, prescription written     Orders Placed This Encounter  Procedures   AFB culture with smear    Standing Status:   Future    Standing Expiration Date:   06/25/2022   ANCA Profile    Standing Status:   Future    Number of Occurrences:   1    Standing Expiration Date:   06/25/2022   AMB REFERRAL FOR DME    Referral Priority:   Routine    Referral Type:   Durable Medical Equipment Purchase    Number of Visits Requested:   1   Meds ordered this encounter  Medications   pantoprazole (PROTONIX) 40 MG tablet    Sig: Take 1 tablet (40 mg total) by mouth daily.    Dispense:  30 tablet    Refill:  3   montelukast (SINGULAIR) 10 MG tablet    Sig: Take 1 tablet daily    Dispense:  30 tablet    Refill:  2   Patient has bronchiectasis with difficulty clearing secretions.  Flutter valve not helping much.  We will proceed with obtaining chest vest.  We are going to check a sputum study to evaluate for potential MAI.  Check ANCA panel follow-up in 4 to 6-week time.  She is to call sooner should any new problems arise.  Renold Don, MD Advanced Bronchoscopy PCCM [facility]    *This note was dictated using voice recognition software/Dragon.  Despite best efforts to proofread, errors can occur which can change the meaning. Any transcriptional errors that result from this process are unintentional and may not be fully corrected at the time of dictation.

## 2021-06-25 NOTE — Patient Instructions (Signed)
We are going to do 1 more blood test called an ANCA test.  We are going to collect sputum to check for organisms.  We have sent in a request for a Smart Vest which is a vest that will help you clear the sputum out of your lungs.  We are going to see you in follow-up in 4 to 6 weeks time call sooner should any new problems arise.

## 2021-06-29 LAB — ANCA PROFILE
Anti-MPO Antibodies: <0.2 units (ref 0.0–0.9)
Anti-PR3 Antibodies: <0.2 units (ref 0.0–0.9)
Atypical P-ANCA titer: <1:20 {titer}
C-ANCA: <1:20 {titer}
P-ANCA: <1:20 {titer}

## 2021-07-24 ENCOUNTER — Encounter: Payer: Self-pay | Admitting: Pulmonary Disease

## 2021-08-18 ENCOUNTER — Ambulatory Visit: Payer: 59 | Admitting: Pulmonary Disease

## 2021-08-23 ENCOUNTER — Ambulatory Visit
Admission: EM | Admit: 2021-08-23 | Discharge: 2021-08-23 | Disposition: A | Discharge status: Home / Self Care | Payer: 59 | Attending: Family Medicine | Admitting: Family Medicine

## 2021-08-23 ENCOUNTER — Ambulatory Visit (INDEPENDENT_AMBULATORY_CARE_PROVIDER_SITE_OTHER): Payer: 59

## 2021-08-23 ENCOUNTER — Encounter: Payer: Self-pay | Admitting: Emergency Medicine

## 2021-08-23 DIAGNOSIS — R059 Cough, unspecified: Secondary | ICD-10-CM

## 2021-08-23 DIAGNOSIS — I889 Nonspecific lymphadenitis, unspecified: Secondary | ICD-10-CM

## 2021-08-23 DIAGNOSIS — J392 Other diseases of pharynx: Secondary | ICD-10-CM

## 2021-08-23 DIAGNOSIS — J989 Respiratory disorder, unspecified: Secondary | ICD-10-CM

## 2021-08-23 DIAGNOSIS — R509 Fever, unspecified: Secondary | ICD-10-CM | POA: Diagnosis not present

## 2021-08-23 MED ORDER — PREDNISONE 20 MG PO TABS: 40.0000 mg | ORAL_TABLET | Freq: Every day | ORAL | 0 refills | Status: DC

## 2021-08-23 MED ORDER — DOXYCYCLINE HYCLATE 100 MG PO CAPS: 100.0000 mg | ORAL_CAPSULE | Freq: Two times a day (BID) | ORAL | 0 refills | Status: DC

## 2021-08-23 NOTE — Discharge Instructions (Addendum)
Your chest x-ray shows chronic lung changes however the right middle lobe there is some suspicion for an evolving pneumonia therefore I am covering you with a another antibiotic.  I would like for you to complete the azithromycin and I am adding doxycycline and would like for you to take that twice daily for the next 10 days. ?I am also adding the prednisone 20 mg once daily to help with the swelling in your throat and your enlarged lymph nodes.  Follow back up with your primary care doctor in  2 weeks for further evaluation to ensure that all of your symptoms have resolved. ?

## 2021-08-23 NOTE — ED Provider Notes (Signed)
?[facility] ? ? ? ?CSN: [id number] ?Arrival date & time: 08/23/21  1232 ? ? ?  ? ?History   ?Chief Complaint ?Chief Complaint  ?Patient presents with  ? Fever  ? ? ?HPI ?Jeanette Sandoval is a 36 y.o. female.  ? ?HPI ?Patient with a history of bronchitis and asthma present today for evaluation of symptoms of fatigue, loss of appetite.  Patient is on day 4 of an azithromycin treatment and she has not completed the medication and reports that her symptoms have worsened and she still having fever.  On arrival today she is afebrile.  She reports no improvement in symptoms.  Seen on 08/20/21 at a different provider's office and had a full respiratory panel completed along with a rapid PCR strep all of which were negative. ?She reports having severe chills, night sweats, and experiencing fever during the middle of the night.  She chronically coughs related to her bronchial stasis which is a chronic condition.  She did not have a chest x-ray done while she was seen at her PCP office.  She just generally reports feeling unwell. ? ?Past Medical History:  ?Diagnosis Date  ? Asthma   ? Bronchiectasis (HCC)   ? ? ?Patient Active Problem List  ? Diagnosis Date Noted  ? Malar rash 03/02/2021  ? Bronchiectasis without complication (HCC) 03/02/2021  ? Allergic rhinitis 03/02/2021  ? Breast pain, left 03/02/2021  ? ? ?Past Surgical History:  ?Procedure Laterality Date  ? APPENDECTOMY  1999  ? CESAREAN SECTION  2019  ? NASAL SINUS SURGERY  2014  ? WISDOM TOOTH EXTRACTION Bilateral 11/2020  ? ? ?OB History   ? ? Gravida  ?0  ? Para  ?0  ? Term  ?0  ? Preterm  ?0  ? AB  ?0  ? Living  ?0  ?  ? ? SAB  ?0  ? IAB  ?0  ? Ectopic  ?0  ? Multiple  ?0  ? Live Births  ?0  ?   ?  ?  ? ? ? ?Home Medications   ? ?Prior to Admission medications   ?Medication Sig Start Date End Date Taking? Authorizing Provider  ?budesonide-formoterol (SYMBICORT) 160-4.5 MCG/ACT inhaler Inhale 2 puffs into the lungs 2 (two) times daily. 05/11/21  Yes Salena Saner, MD  ?montelukast (SINGULAIR) 10 MG tablet Take 1 tablet daily 06/25/21   Salena Saner, MD  ?pantoprazole (PROTONIX) 40 MG tablet Take 1 tablet (40 mg total) by mouth daily. Take 30-60 min before first meal of the day 03/31/21   Nyoka Cowden, MD  ?pantoprazole (PROTONIX) 40 MG tablet Take 1 tablet (40 mg total) by mouth daily. 06/25/21 06/25/22  Salena Saner, MD  ? ? ?Family History ?Family History  ?Problem Relation Age of Onset  ? Lupus Mother   ? Stomach cancer Maternal Aunt   ? Ovarian cancer Maternal Aunt   ? Coronary artery disease Maternal Grandmother   ? Peripheral Artery Disease Maternal Grandmother   ? Liver cancer Paternal Grandmother   ? ? ?Social History ?Social History  ? ?Tobacco Use  ? Smoking status: Never  ? Smokeless tobacco: Never  ?Vaping Use  ? Vaping Use: Never used  ?Substance Use Topics  ? Alcohol use: Not Currently  ? Drug use: Never  ? ? ? ?Allergies   ?Patient has no known allergies. ? ? ?Review of Systems ?Review of Systems ?Pertinent negatives listed in HPI  ?Physical Exam ?Triage Vital Signs ?ED  Triage Vitals  ?Enc Vitals Group  ?   BP 08/23/21 1424 121/67  ?   Pulse Rate 08/23/21 1424 96  ?   Resp 08/23/21 1424 16  ?   Temp 08/23/21 1424 98.3 ?F (36.8 ?C)  ?   Temp Source 08/23/21 1424 Oral  ?   SpO2 08/23/21 1424 95 %  ?   Weight --   ?   Height --   ?   Head Circumference --   ?   Peak Flow --   ?   Pain Score 08/23/21 1420 5  ?   Pain Loc --   ?   Pain Edu? --   ?   Excl. in GC? --   ? ?No data found. ? ?Updated Vital Signs ?BP 121/67 (BP Location: Right Arm)   Pulse 96   Temp 98.3 ?F (36.8 ?C) (Oral)   Resp 16   LMP 08/19/2021   SpO2 95%  ? ?Visual Acuity ?Right Eye Distance:   ?Left Eye Distance:   ?Bilateral Distance:   ? ?Right Eye Near:   ?Left Eye Near:    ?Bilateral Near:    ? ?Physical Exam ?Constitutional:   ?   Appearance: She is ill-appearing.  ?HENT:  ?   Head: Normocephalic and atraumatic.  ?   Nose: Congestion present.  ?   Mouth/Throat:  ?    Pharynx: Pharyngeal swelling and posterior oropharyngeal erythema present.  ?   Tonsils: 2+ on the right. 2+ on the left.  ?Cardiovascular:  ?   Rate and Rhythm: Normal rate and regular rhythm.  ?Pulmonary:  ?   Breath sounds: Decreased air movement present. Rhonchi present. No wheezing.  ?Lymphadenopathy:  ?   Cervical: Cervical adenopathy present.  ?Psychiatric:     ?   Behavior: Behavior is cooperative.  ? ? ? ?UC Treatments / Results  ?Labs ?(all labs ordered are listed, but only abnormal results are displayed) ?Labs Reviewed - No data to display ? ?EKG ? ? ?Radiology ?No results found. ? ?Procedures ?Procedures (including critical care time) ? ?Medications Ordered in UC ?Medications - No data to display ? ?Initial Impression / Assessment and Plan / UC Course  ?I have reviewed the triage vital signs and the nursing notes. ? ?Pertinent labs & imaging results that were available during my care of the patient were reviewed by me and considered in my medical decision making (see chart for details). ? ?  ?Covering for possible pneumonia given patient's symptoms and generally feeling unwell and not improving with her current treatment.  Chest x-ray showed a chronic opacity however it could not rule out any type of an evolving infection.  We will add doxycycline advised patient to continue and complete azithromycin.  Patient has pronounce cervical adenitis and oropharyngeal swelling with mild erythema however the azithromycin will cover for strep but will add prednisone to help reduce swelling as patient's not eating.  Strict ER precautions given.  Advised patient to follow-up with her PCP in 2 weeks. ?Final Clinical Impressions(s) / UC Diagnoses  ? ?Final diagnoses:  ?Febrile respiratory illness  ?Pharyngeal swelling  ?Cervical adenitis  ? ?Discharge Instructions   ?None ?  ? ?ED Prescriptions   ? ? Medication Sig Dispense Auth. Provider  ? predniSONE (DELTASONE) 20 MG tablet Take 2 tablets (40 mg total) by mouth  daily with breakfast. 10 tablet Bing NeighborsHarris, Sayward Horvath S, FNP  ? doxycycline (VIBRAMYCIN) 100 MG capsule Take 1 capsule (100 mg total) by mouth 2 (two)  times daily. 20 capsule Bing Neighbors, FNP  ? ?  ? ?PDMP not reviewed this encounter. ?  ?Bing Neighbors, FNP ?08/23/21 1520 ? ?

## 2021-08-23 NOTE — ED Triage Notes (Signed)
Pt presents with fever, loss of appetite, nausea, and fatigue x 6 days. Pt was seen at an urgent care on 08/20/21 and prescribed abx but she is not better.  ?

## 2021-09-15 ENCOUNTER — Other Ambulatory Visit: Payer: Self-pay | Admitting: Pulmonary Disease

## 2021-10-16 ENCOUNTER — Ambulatory Visit: Payer: 59 | Admitting: Pulmonary Disease

## 2021-10-16 ENCOUNTER — Encounter: Payer: Self-pay | Admitting: Pulmonary Disease

## 2021-10-16 VITALS — BP 122/70 | HR 62 | Temp 97.7°F | Ht 66.0 in | Wt 147.8 lb

## 2021-10-16 DIAGNOSIS — R053 Chronic cough: Secondary | ICD-10-CM

## 2021-10-16 DIAGNOSIS — J3089 Other allergic rhinitis: Secondary | ICD-10-CM | POA: Diagnosis not present

## 2021-10-16 DIAGNOSIS — J479 Bronchiectasis, uncomplicated: Secondary | ICD-10-CM | POA: Diagnosis not present

## 2021-10-16 DIAGNOSIS — J302 Other seasonal allergic rhinitis: Secondary | ICD-10-CM

## 2021-10-16 DIAGNOSIS — J4540 Moderate persistent asthma, uncomplicated: Secondary | ICD-10-CM

## 2021-10-16 MED ORDER — BUDESONIDE-FORMOTEROL FUMARATE 160-4.5 MCG/ACT IN AERO: 2.0000 | INHALATION_SPRAY | Freq: Two times a day (BID) | RESPIRATORY_TRACT | 11 refills | Status: DC

## 2021-10-16 MED ORDER — ALBUTEROL SULFATE HFA 108 (90 BASE) MCG/ACT IN AERS: 2.0000 | INHALATION_SPRAY | Freq: Four times a day (QID) | RESPIRATORY_TRACT | 2 refills | Status: DC | PRN

## 2021-10-16 NOTE — Patient Instructions (Signed)
We have sent prescriptions for your albuterol and Symbicort to your pharmacy.  Try to use your vest at least once a day.  This will help you stay clear and prevent infections.  We will see you in follow-up in 3 months time call sooner should any new problems arise.

## 2021-10-16 NOTE — Progress Notes (Signed)
Subjective:    Patient ID: Jeanette Sandoval, female    DOB: 03-26-1986, 36 y.o.   MRN: [id number] Patient Care Team: Jerrol Banana, MD as PCP - General [facility])  Chief Complaint  Patient presents with   Follow-up    Occ SOB with exertion, wheezing and prod cough with green sputum.    HPI Patient is a 36 year old lifelong never smoker, native of [place], who follows here for the issue of bronchiectasis. This is a scheduled visit.  Her initial visit with me was on 25 June 2021 for the details of that visit please refer to that note.  Since that visit she had an episode of probable pneumonia in the latter parts of March to earlier part of April.  This issue has now resolved.  She was started on vest physiotherapy.  She has difficulties with compliance due to her work schedule but tries to at least gets once a day therapy.  Notes that since she started the vest therapy and has been consistent with Symbicort her chronic cough has subsided and now its only on occasion that she will notice cough with sputum production.  Her gastroesophageal reflux symptoms have also subsided.  She is following antireflux measures.  Currently feels that the montelukast controls her rhinitis symptoms.  This also helps controlling her cough.  Feels that Symbicort is being very effective.  Has very rare episodes of breathlessness for which albuterol helps.  She uses it rarely.  She does need a refill today.  Overall she feels well and looks well.  She has no other complaints.  She has had extensive connective tissue disease work-up and allergy work-up which has been unremarkable.  PFTs on 24 April 2021 showed FEV1 of 2.54 L or 77% predicted, FVC of 3.36 L or 86% predicted, FEV1/FVC 76%, no significant bronchodilator response.  Diffusion capacity normal.  FEF 25-75% reduced, test consistent with mild airway obstruction.  She had bronchoscopy in [place] in the past which was negative.  Review of  Systems A 10 point review of systems was performed and it is as noted above otherwise negative.  Patient Active Problem List   Diagnosis Date Noted   Malar rash 03/02/2021   Bronchiectasis without complication (HCC) 03/02/2021   Allergic rhinitis 03/02/2021   Breast pain, left 03/02/2021   Social History   Tobacco Use   Smoking status: Never   Smokeless tobacco: Never  Substance Use Topics   Alcohol use: Not Currently   No Known Allergies Current Meds  Medication Sig   budesonide-formoterol (SYMBICORT) 160-4.5 MCG/ACT inhaler Inhale 2 puffs into the lungs 2 (two) times daily.   montelukast (SINGULAIR) 10 MG tablet TAKE 1 TABLET BY MOUTH EVERY DAY   [DISCONTINUED] doxycycline (VIBRAMYCIN) 100 MG capsule Take 1 capsule (100 mg total) by mouth 2 (two) times daily.   [DISCONTINUED] pantoprazole (PROTONIX) 40 MG tablet Take 1 tablet (40 mg total) by mouth daily. Take 30-60 min before first meal of the day   [DISCONTINUED] pantoprazole (PROTONIX) 40 MG tablet Take 1 tablet (40 mg total) by mouth daily.   [DISCONTINUED] predniSONE (DELTASONE) 20 MG tablet Take 2 tablets (40 mg total) by mouth daily with breakfast.   Immunization History  Administered Date(s) Administered   Influenza-Unspecified 02/25/2021       Objective:   Physical Exam BP 122/70 (BP Location: Left Arm, Cuff Size: Normal)   Pulse 62   Temp 97.7 F (36.5 C) (Temporal)   Ht 5\' 6"  (1.676 m)  Wt 147 lb 12.8 oz (67 kg)   SpO2 98%   BMI 23.86 kg/m  GENERAL: Well-developed, well-nourished woman, no acute distress.  Fully ambulatory.  No conversational dyspnea HEAD: Normocephalic, atraumatic.  EYES: Pupils equal, round, reactive to light.  No scleral icterus.  MOUTH: Nose/mouth/throat not examined due to masking requirements for COVID 19. NECK: Supple. No thyromegaly. Trachea midline. No JVD.  No adenopathy. PULMONARY: Good air entry bilaterally.  Clear to auscultation bilaterally.   CARDIOVASCULAR: S1 and S2.  Regular rate and rhythm.  No rubs, murmurs or gallops heard. ABDOMEN: Benign. MUSCULOSKELETAL: No joint deformity, no clubbing, no edema.  NEUROLOGIC: No overt focal deficit, no gait disturbance, speech is fluent. SKIN: Intact,warm,dry. PSYCH: Mood and behavior normal     Assessment & Plan:     ICD-10-CM   1. Bronchiectasis without complication (HCC)  J47.9    Continue pulmonary hygiene Vest physiotherapy Try to be consistent at least once a day    2. Moderate persistent asthma without complication  J45.40    Continue Symbicort 160/4.5, 2 inhalations twice a day Continue as needed albuterol, refilled    3. Chronic cough  R05.3    Marked improvement since started vest Continue use of vest for airway clearance     4. Perennial allergic rhinitis with seasonal variation  J30.89    J30.2    Controlled with montelukast Continue nasal hygiene     Meds ordered this encounter  Medications   albuterol (VENTOLIN HFA) 108 (90 Base) MCG/ACT inhaler    Sig: Inhale 2 puffs into the lungs every 6 (six) hours as needed for wheezing or shortness of breath.    Dispense:  8 g    Refill:  2   budesonide-formoterol (SYMBICORT) 160-4.5 MCG/ACT inhaler    Sig: Inhale 2 puffs into the lungs 2 (two) times daily.    Dispense:  1 each    Refill:  11   Continue current regimen.  She was reminded to try to use the vest at least once a day this will help with clearance of secretions.  We will see her in follow-up in 3 months time she is to contact us prior to that time should any new difficulties arise.  Gailen Shelter, MD Advanced Bronchoscopy PCCM [facility]-    *This note was dictated using voice recognition software/Dragon.  Despite best efforts to proofread, errors can occur which can change the meaning. Any transcriptional errors that result from this process are unintentional and may not be fully corrected at the time of dictation.

## 2021-11-10 ENCOUNTER — Encounter: Payer: Self-pay | Admitting: Pulmonary Disease

## 2021-11-10 MED ORDER — AMOXICILLIN-POT CLAVULANATE 875-125 MG PO TABS: 1.0000 | ORAL_TABLET | Freq: Two times a day (BID) | ORAL | 0 refills | Status: DC

## 2021-11-10 NOTE — Telephone Encounter (Signed)
I believe she is having perhaps a bronchiectasis exacerbation.  We will start her on Augmentin 875 mg, 1 tablet twice a day with food for 10 days.  We will move her appointment up for 2 to 3 weeks.  She should come to the ED if her cough is more than a tablespoon at a time or if she develops fever.  Call sooner with any questions or concerns.

## 2021-11-10 NOTE — Telephone Encounter (Signed)
Dr Jayme Cloud I am reaching out to you since I started coughing up some blood. It started yesterday. I have just a little pain on my right ribs. No fever. A little shortness of breath.    I just don't know if I should look for medical attention or if it is just a matter of resting.   I'll look forward to reading from you.   Thanks!   Dr. Jayme Cloud, please advise. Picture attached.

## 2021-11-26 ENCOUNTER — Ambulatory Visit (INDEPENDENT_AMBULATORY_CARE_PROVIDER_SITE_OTHER): Payer: 59 | Admitting: Pulmonary Disease

## 2021-11-26 ENCOUNTER — Encounter: Payer: Self-pay | Admitting: Pulmonary Disease

## 2021-11-26 VITALS — BP 116/70 | HR 78 | Temp 97.9°F | Ht 66.0 in | Wt 145.0 lb

## 2021-11-26 DIAGNOSIS — J479 Bronchiectasis, uncomplicated: Secondary | ICD-10-CM | POA: Diagnosis not present

## 2021-11-26 DIAGNOSIS — R053 Chronic cough: Secondary | ICD-10-CM | POA: Diagnosis not present

## 2021-11-26 DIAGNOSIS — J4540 Moderate persistent asthma, uncomplicated: Secondary | ICD-10-CM | POA: Diagnosis not present

## 2021-11-26 NOTE — Patient Instructions (Signed)
We have given you samples cups for collection of sputum.  Follow the instructions for collection and then bring it to the lab for analysis.  We will call you antibiotic pending the results.   We will see you in follow-up in 4 to 6 weeks time call sooner should any new problems arise.

## 2021-11-26 NOTE — Progress Notes (Signed)
Subjective:    Patient ID: Jeanette Sandoval, female    DOB: September 19, 1985, 36 y.o.   MRN: [id number] Patient Care Team: Jerrol Banana, MD as PCP - General [facility])  Chief Complaint  Patient presents with   Follow-up    C/o prod cough with green sputum. Recently completed course of abx.    HPI Patient is a 36 year old lifelong never smoker, native of [place], who follows here for the issue of bronchiectasis.  This is a scheduled visit.  Last visit with me was 16 Oct 2021.  She did have a mild exacerbation on 20 June with some hemoptysis, purulent sputum production which was copious and some shortness of breath.  Treated with Augmentin for 10 days.  She states that after that therapy her hemoptysis subsided very quickly and she actually had clear sputum for the first time in many years.  However now the sputum is turning yellow to green again.  Recall that she has had extensive work-up for her bronchiectasis including bronchoscopy in the past (in [place]).  She does note that since she started vest physiotherapy her cough has pretty much resolved and her sputum production is more manageable.  She has not had any recent fevers, chills or sweats since her exacerbation.  No chest pain.  No shortness of breath.  No other new symptomatology.  She has been compliant with her Symbicort.  She has not needed albuterol over the last 34 weeks.  Review of Systems A 10 point review of systems was performed and it is as noted above otherwise negative.  Patient Active Problem List   Diagnosis Date Noted   Malar rash 03/02/2021   Bronchiectasis without complication (HCC) 03/02/2021   Allergic rhinitis 03/02/2021   Breast pain, left 03/02/2021   Social History   Tobacco Use   Smoking status: Never   Smokeless tobacco: Never  Substance Use Topics   Alcohol use: Not Currently   No Known Allergies  Current Meds  Medication Sig   albuterol (VENTOLIN HFA) 108 (90 Base) MCG/ACT inhaler Inhale  2 puffs into the lungs every 6 (six) hours as needed for wheezing or shortness of breath.   budesonide-formoterol (SYMBICORT) 160-4.5 MCG/ACT inhaler Inhale 2 puffs into the lungs 2 (two) times daily.   montelukast (SINGULAIR) 10 MG tablet TAKE 1 TABLET BY MOUTH EVERY DAY   [DISCONTINUED] amoxicillin-clavulanate (AUGMENTIN) 875-125 MG tablet Take 1 tablet by mouth 2 (two) times daily.   Immunization History  Administered Date(s) Administered   Influenza-Unspecified 02/25/2021      Objective:   Physical Exam BP 116/70 (BP Location: Left Arm, Cuff Size: Normal)   Pulse 78   Temp 97.9 F (36.6 C) (Temporal)   Ht 5\' 6"  (1.676 m)   Wt 145 lb (65.8 kg)   SpO2 98%   BMI 23.40 kg/m  GENERAL: Well-developed, well-nourished woman, no acute distress.  Fully ambulatory.  No conversational dyspnea HEAD: Normocephalic, atraumatic.  EYES: Pupils equal, round, reactive to light.  No scleral icterus.  MOUTH: Nose/mouth/throat not examined due to masking requirements for COVID 19. NECK: Supple. No thyromegaly. Trachea midline. No JVD.  No adenopathy. PULMONARY: Good air entry bilaterally.  Clear to auscultation bilaterally.   CARDIOVASCULAR: S1 and S2. Regular rate and rhythm.  No rubs, murmurs or gallops heard. ABDOMEN: Benign. MUSCULOSKELETAL: No joint deformity, no clubbing, no edema.  NEUROLOGIC: No overt focal deficit, no gait disturbance, speech is fluent. SKIN: Intact,warm,dry. PSYCH: Mood and behavior normal     Assessment &  Plan:     ICD-10-CM   1. Bronchiectasis without complication (HCC)  J47.9 Respiratory or Resp and Sputum Culture     MYCOBACTERIA, CULTURE, WITH FLUOROCHROME SMEAR   Resolved recent exacerbation Sputum character changing again Sputum culture May need Azithromycin 3 times weekly Await sputum culture Continue vest CPT    2. Moderate persistent asthma without complication  J45.40    Continue Symbicort and as needed albuterol Well compensated at present    3.  Chronic cough  R05.3    Markedly improved with vest CPT Continue same     Orders Placed This Encounter  Procedures   Respiratory or Resp and Sputum Culture    Standing Status:   Future    Standing Expiration Date:   11/27/2022    MYCOBACTERIA, CULTURE, WITH FLUOROCHROME SMEAR    Standing Status:   Future    Standing Expiration Date:   05/29/2022   Will recollect sputum for culture, routine and AFB (considering MAI).  We will see the patient in follow-up in weeks time she is to contact us prior to that time should any new difficulties arise.   Gailen Shelter, MD Advanced Bronchoscopy PCCM [facility] [facility]    *This note was dictated using voice recognition software/Dragon.  Despite best efforts to proofread, errors can occur which can change the meaning. Any transcriptional errors that result from this process are unintentional and may not be fully corrected at the time of dictation.

## 2021-12-03 ENCOUNTER — Encounter: Payer: Self-pay | Admitting: Emergency Medicine

## 2021-12-03 ENCOUNTER — Other Ambulatory Visit
Admission: RE | Admit: 2021-12-03 | Discharge: 2021-12-03 | Disposition: A | Discharge status: Home / Self Care | Payer: 59 | Source: Ambulatory Visit | Attending: Pulmonary Disease | Admitting: Pulmonary Disease

## 2021-12-03 ENCOUNTER — Ambulatory Visit
Admission: EM | Admit: 2021-12-03 | Discharge: 2021-12-03 | Disposition: A | Discharge status: Home / Self Care | Payer: 59 | Attending: Family Medicine | Admitting: Family Medicine

## 2021-12-03 DIAGNOSIS — J479 Bronchiectasis, uncomplicated: Secondary | ICD-10-CM | POA: Insufficient documentation

## 2021-12-03 DIAGNOSIS — H66002 Acute suppurative otitis media without spontaneous rupture of ear drum, left ear: Secondary | ICD-10-CM | POA: Diagnosis not present

## 2021-12-03 MED ORDER — AMOXICILLIN 875 MG PO TABS: 875.0000 mg | ORAL_TABLET | Freq: Two times a day (BID) | ORAL | 0 refills | Status: AC

## 2021-12-03 NOTE — Discharge Instructions (Addendum)
Recommend 7 days of antibiotics treatment initially for ear infection, if no improvement complete the entire 10 day course, due to recently completing antibiotic therapy. I would recommend avoiding swimming for minimum of 5 days to prevent worsening of the infection. Recommend Tylenol or ibuprofen for pain along with compresses on the left side of your face to help with pain associated with inflamed looking skin ear infection

## 2021-12-03 NOTE — ED Provider Notes (Signed)
Renaldo Fiddler    CSN: [id number] Arrival date & time: 12/03/21  1126      History   Chief Complaint Chief Complaint  Patient presents with   Otalgia    HPI Jeanette Sandoval is a 36 y.o. female.   HPI Patient presents for evaluation left ear and left jaw x 2 days. She states the pain started following swimming recently. She has no history of recurrent ear infections. She was recently treated for lower respiratory infection and completed Augmentin x 3 weeks ago. She has taken otc medication for pain without relief of symptoms. Recent LRI illness has resolved.   Past Medical History:  Diagnosis Date   Asthma    Bronchiectasis [facility])     Patient Active Problem List   Diagnosis Date Noted   Malar rash 03/02/2021   Bronchiectasis without complication (HCC) 03/02/2021   Allergic rhinitis 03/02/2021   Breast pain, left 03/02/2021    Past Surgical History:  Procedure Laterality Date   APPENDECTOMY  1999   CESAREAN SECTION  2019   NASAL SINUS SURGERY  2014   WISDOM TOOTH EXTRACTION Bilateral 11/2020    OB History     Gravida  0   Para  0   Term  0   Preterm  0   AB  0   Living  0      SAB  0   IAB  0   Ectopic  0   Multiple  0   Live Births  0            Home Medications    Prior to Admission medications   Medication Sig Start Date End Date Taking? Authorizing Provider  amoxicillin (AMOXIL) 875 MG tablet Take 1 tablet (875 mg total) by mouth 2 (two) times daily for 10 days. 12/03/21 12/13/21 Yes Bing Neighbors, FNP  budesonide-formoterol (SYMBICORT) 160-4.5 MCG/ACT inhaler Inhale 2 puffs into the lungs 2 (two) times daily. 10/16/21  Yes Salena Saner, MD  montelukast (SINGULAIR) 10 MG tablet TAKE 1 TABLET BY MOUTH EVERY DAY 09/15/21  Yes Salena Saner, MD  albuterol (VENTOLIN HFA) 108 (90 Base) MCG/ACT inhaler Inhale 2 puffs into the lungs every 6 (six) hours as needed for wheezing or shortness of breath. 10/16/21   Salena Saner, MD    Family History Family History  Problem Relation Age of Onset   Lupus Mother    Stomach cancer Maternal Aunt    Ovarian cancer Maternal Aunt    Coronary artery disease Maternal Grandmother    Peripheral Artery Disease Maternal Grandmother    Liver cancer Paternal Grandmother     Social History Social History   Tobacco Use   Smoking status: Never   Smokeless tobacco: Never  Vaping Use   Vaping Use: Never used  Substance Use Topics   Alcohol use: Not Currently   Drug use: Never     Allergies   Patient has no known allergies.   Review of Systems Review of Systems Pertinent negatives listed in HPI  Physical Exam Triage Vital Signs ED Triage Vitals  Enc Vitals Group     BP 12/03/21 1151 103/70     Pulse Rate 12/03/21 1151 75     Resp 12/03/21 1151 16     Temp 12/03/21 1151 98.3 F (36.8 C)     Temp Source 12/03/21 1151 Oral     SpO2 12/03/21 1151 95 %     Weight --  Height --      Head Circumference --      Peak Flow --      Pain Score 12/03/21 1157 6     Pain Loc --      Pain Edu? --      Excl. in GC? --    No data found.  Updated Vital Signs BP 103/70 (BP Location: Left Arm)   Pulse 75   Temp 98.3 F (36.8 C) (Oral)   Resp 16   LMP 11/07/2021   SpO2 95%   Visual Acuity Right Eye Distance:   Left Eye Distance:   Bilateral Distance:    Right Eye Near:   Left Eye Near:    Bilateral Near:     Physical Exam Vitals and nursing note reviewed.  Constitutional:      Appearance: Normal appearance.  HENT:     Head:     Comments: Left preauricular adenopathy, tenderness with palpation     Right Ear: Tympanic membrane, ear canal and external ear normal.     Left Ear: Swelling and tenderness present. Tympanic membrane is erythematous and bulging.  Eyes:     General: Lids are normal. Lids are everted, no foreign bodies appreciated. Vision grossly intact. Gaze aligned appropriately.  Cardiovascular:     Rate and Rhythm: Normal  rate and regular rhythm.  Pulmonary:     Effort: Pulmonary effort is normal.     Breath sounds: Normal breath sounds and air entry.  Musculoskeletal:     Cervical back: Full passive range of motion without pain, normal range of motion and neck supple.  Psychiatric:        Attention and Perception: Attention and perception normal.        Mood and Affect: Mood normal.        Speech: Speech normal.    UC Treatments / Results  Labs (all labs ordered are listed, but only abnormal results are displayed) Labs Reviewed - No data to display  EKG   Radiology No results found.  Procedures Procedures (including critical care time)  Medications Ordered in UC Medications - No data to display  Initial Impression / Assessment and Plan / UC Course  I have reviewed the triage vital signs and the nursing notes.  Pertinent labs & imaging results that were available during my care of the patient were reviewed by me and considered in my medical decision making (see chart for details).    Non-recurrent acute suppurative otitis media left ear with preauricular adenopathy  Treatment per discharge orders and recommendations. Return if symptoms worsen or do not improve. Final Clinical Impressions(s) / UC Diagnoses   Final diagnoses:  Non-recurrent acute suppurative otitis media of left ear without spontaneous rupture of tympanic membrane     Discharge Instructions      Recommend 7 days of antibiotics treatment initially for ear infection, if no improvement complete the entire 10 day course, due to recently completing antibiotic therapy. I would recommend avoiding swimming for minimum of 5 days to prevent worsening of the infection. Recommend Tylenol or ibuprofen for pain along with compresses on the left side of your face to help with pain associated with inflamed looking skin ear infection     ED Prescriptions     Medication Sig Dispense Auth. Provider   amoxicillin (AMOXIL) 875 MG  tablet Take 1 tablet (875 mg total) by mouth 2 (two) times daily for 10 days. 20 tablet Bing Neighbors, FNP  PDMP not reviewed this encounter.   Bing Neighbors, FNP 12/03/21 1247

## 2021-12-03 NOTE — ED Triage Notes (Signed)
Pt c/o left ear pain and jaw pain x 2 days. She states the pain started after swimming.

## 2021-12-04 LAB — ACID FAST SMEAR (AFB, MYCOBACTERIA): Acid Fast Smear: NEGATIVE

## 2021-12-05 LAB — CULTURE, RESPIRATORY W GRAM STAIN: Culture: NORMAL

## 2022-01-18 LAB — ACID FAST CULTURE WITH REFLEXED SENSITIVITIES (MYCOBACTERIA): Acid Fast Culture: NEGATIVE

## 2022-01-21 ENCOUNTER — Encounter: Payer: Self-pay | Admitting: Pulmonary Disease

## 2022-01-21 ENCOUNTER — Ambulatory Visit: Payer: 59 | Admitting: Pulmonary Disease

## 2022-01-21 VITALS — BP 120/64 | HR 83 | Temp 98.0°F | Ht 65.0 in | Wt 150.0 lb

## 2022-01-21 DIAGNOSIS — J302 Other seasonal allergic rhinitis: Secondary | ICD-10-CM | POA: Diagnosis not present

## 2022-01-21 DIAGNOSIS — J4540 Moderate persistent asthma, uncomplicated: Secondary | ICD-10-CM | POA: Diagnosis not present

## 2022-01-21 DIAGNOSIS — J3089 Other allergic rhinitis: Secondary | ICD-10-CM

## 2022-01-21 DIAGNOSIS — J479 Bronchiectasis, uncomplicated: Secondary | ICD-10-CM

## 2022-01-21 NOTE — Progress Notes (Signed)
Subjective:    Patient ID: Jeanette Sandoval, female    DOB: Jun 10, 1985, 36 y.o.   MRN: [id number] Patient Care Team: Montel Culver, MD as PCP - General [facility])  Chief Complaint  Patient presents with   Follow-up    Prod cough with white to yellow sputum and SOB with incline.    HPI Jeanette Sandoval is a 36 year old lifelong never smoker, native of [place], who follows here for the issue of bronchiectasis.  This is a scheduled visit.  Last visit with me was 26 November 2021.  She did have a mild exacerbation on 20 June with some hemoptysis, purulent sputum production which was copious and some shortness of breath.  Treated with Augmentin for 10 days.  She states that after that therapy her hemoptysis subsided very quickly and she actually had clear sputum for the first time in many years.  At her prior visit she had had recurrence of purulent sputum and a sputum collection for C&S was done.  This did not show any Staph aureus or Pseudomonas, mostly normal respiratory flora.  At that time we recommended that she be consistent with twice a day usage of her vest physiotherapy.  Recall that she has had extensive work-up for her bronchiectasis including bronchoscopy in the past (in [place]).  She does note that since she started vest physiotherapy her cough has pretty much resolved and her sputum production is more manageable.  She has not had any recent fevers, chills or sweats since her exacerbation.  No chest pain.  No shortness of breath.  No other new symptomatology.  She has been compliant with her Symbicort.  She has not needed albuterol o at all since her prior visit.  Review of Systems A 10 point review of systems was performed and it is as noted above otherwise negative.  Patient Active Problem List   Diagnosis Date Noted   Shortness of breath 06/11/2022   Anxiety and depression 06/08/2022   Chest pain of uncertain etiology [id number]   Annual physical exam 03/08/2022   Cervical cancer  screening 03/08/2022   Irregular menses 03/08/2022   Malar rash 03/02/2021   Bronchiectasis without complication ([place]) [id number]   Allergic rhinitis 03/02/2021   Breast pain, left 03/02/2021   Social History   Tobacco Use   Smoking status: Never   Smokeless tobacco: Never  Substance Use Topics   Alcohol use: Not Currently   No Known Allergies  Current Meds  Medication Sig   albuterol (VENTOLIN HFA) 108 (90 Base) MCG/ACT inhaler Inhale 2 puffs into the lungs every 6 (six) hours as needed for wheezing or shortness of breath.   budesonide-formoterol (SYMBICORT) 160-4.5 MCG/ACT inhaler Inhale 2 puffs into the lungs 2 (two) times daily.   montelukast (SINGULAIR) 10 MG tablet TAKE 1 TABLET BY MOUTH EVERY DAY   Immunization History  Administered Date(s) Administered   Influenza-Unspecified 02/25/2021, 03/03/2022   Pfizer Covid-19 Vaccine Bivalent Booster 48yr & up 06/10/2020       Objective:   Physical Exam BP 120/64 (BP Location: Left Arm, Cuff Size: Normal)   Pulse 83   Temp 98 F (36.7 C) (Temporal)   Ht '5\' 5"'$  (1.651 m)   Wt 150 lb (68 kg)   SpO2 97%   BMI 24.96 kg/m   SpO2: 97 % O2 Device: None (Room air)  GENERAL: Well-developed, well-nourished woman, no acute distress.  Fully ambulatory.  No conversational dyspnea HEAD: Normocephalic, atraumatic.  EYES: Pupils equal, round, reactive to light.  No  scleral icterus.  MOUTH: Dentition intact, no mucosal abnormalities. NECK: Supple. No thyromegaly. Trachea midline. No JVD.  No adenopathy. PULMONARY: Good air entry bilaterally.  No adventitious sounds.   CARDIOVASCULAR: S1 and S2. Regular rate and rhythm.  No rubs, murmurs or gallops heard. ABDOMEN: Benign. MUSCULOSKELETAL: No joint deformity, no clubbing, no edema.  NEUROLOGIC: No overt focal deficit, no gait disturbance, speech is fluent. SKIN: Intact,warm,dry. PSYCH: Mood and behavior normal.     Assessment & Plan:     ICD-10-CM   1. Bronchiectasis without  complication ([place])  A999333    Continue vest physiotherapy Continue pulmonary hygiene    2. Moderate persistent asthma without complication  123456    Continue Symbicort, as needed albuterol Well compensated    3. Perennial allergic rhinitis with seasonal variation  J30.89    J30.2    Continue Singulair 10 mg daily      Will see the patient in follow-up in 4 months time call sooner should any new problems arise.  Renold Don, MD Advanced Bronchoscopy PCCM [facility]    *This note was dictated using voice recognition software/Dragon.  Despite best efforts to proofread, errors can occur which can change the meaning. Any transcriptional errors that result from this process are unintentional and may not be fully corrected at the time of dictation.

## 2022-01-21 NOTE — Patient Instructions (Signed)
Your lungs sounded really good today.  Continue using your vest physiotherapy.  Continue your Symbicort and as needed albuterol.  Continue Singulair.  We will see you in follow-up in 4 months time call sooner should any new problems arise.  Tus pulmones sonaron muy bien hoy.  Contine usando su chaleco de fisioterapia.  Contine con Symbicort y, segn sea necesario, albuterol.  Continuar Singulair.  Nos veremos en el seguimiento dentro de 4 meses y llamaremos antes si surge algn problema nuevo.

## 2022-02-10 ENCOUNTER — Other Ambulatory Visit: Payer: Self-pay | Admitting: Pulmonary Disease

## 2022-03-08 ENCOUNTER — Ambulatory Visit (INDEPENDENT_AMBULATORY_CARE_PROVIDER_SITE_OTHER): Payer: 59 | Admitting: Family Medicine

## 2022-03-08 ENCOUNTER — Encounter: Payer: Self-pay | Admitting: Family Medicine

## 2022-03-08 VITALS — BP 118/84 | HR 80 | Ht 66.0 in | Wt 155.6 lb

## 2022-03-08 DIAGNOSIS — J479 Bronchiectasis, uncomplicated: Secondary | ICD-10-CM

## 2022-03-08 DIAGNOSIS — Z0000 Encounter for general adult medical examination without abnormal findings: Secondary | ICD-10-CM | POA: Insufficient documentation

## 2022-03-08 DIAGNOSIS — Z114 Encounter for screening for human immunodeficiency virus [HIV]: Secondary | ICD-10-CM

## 2022-03-08 DIAGNOSIS — Z124 Encounter for screening for malignant neoplasm of cervix: Secondary | ICD-10-CM | POA: Diagnosis not present

## 2022-03-08 DIAGNOSIS — Z1159 Encounter for screening for other viral diseases: Secondary | ICD-10-CM | POA: Diagnosis not present

## 2022-03-08 DIAGNOSIS — Z13220 Encounter for screening for lipoid disorders: Secondary | ICD-10-CM

## 2022-03-08 DIAGNOSIS — R7989 Other specified abnormal findings of blood chemistry: Secondary | ICD-10-CM

## 2022-03-08 DIAGNOSIS — N644 Mastodynia: Secondary | ICD-10-CM

## 2022-03-08 DIAGNOSIS — N926 Irregular menstruation, unspecified: Secondary | ICD-10-CM

## 2022-03-08 NOTE — Assessment & Plan Note (Signed)
Annual examination completed, risk stratification labs ordered, anticipatory guidance provided.  We will follow labs once resulted. 

## 2022-03-08 NOTE — Assessment & Plan Note (Signed)
Established with pulmonology, symptoms are stable on current regimen including Symbicort, Singulair, and as needed albuterol.  We will continue to follow peripherally on this issue.

## 2022-03-08 NOTE — Progress Notes (Signed)
Annual Physical Exam Visit  Patient Information:  Patient ID: Jeanette Sandoval, female DOB: June 12, 1985 Age: 36 y.o. MRN: [id number]   Subjective:   CC: Annual Physical Exam  HPI:  Jeanette Sandoval is here for their annual physical.  I reviewed the past medical history, family history, social history, surgical history, and allergies today and changes were made as necessary.  Please see the problem list section below for additional details.  Past Medical History: Past Medical History:  Diagnosis Date   Asthma    Bronchiectasis ([place])    Past Surgical History: Past Surgical History:  Procedure Laterality Date   APPENDECTOMY  1999   CESAREAN SECTION  2019   NASAL SINUS SURGERY  2014   WISDOM TOOTH EXTRACTION Bilateral 11/2020   Family History: Family History  Problem Relation Age of Onset   Lupus Mother    Stomach cancer Maternal Aunt    Ovarian cancer Maternal Aunt    Coronary artery disease Maternal Grandmother    Peripheral Artery Disease Maternal Grandmother    Liver cancer Paternal Grandmother    Allergies: No Known Allergies Health Maintenance: Health Maintenance  Topic Date Due   Hepatitis C Screening  Never done   PAP SMEAR-Modifier  Never done   TETANUS/TDAP  03/25/2022 (Originally 05/20/2007)   INFLUENZA VACCINE  Completed   COVID-19 Vaccine  Completed   HIV Screening  Completed   HPV VACCINES  Aged Out    HM Colonoscopy     This patient has no relevant Health Maintenance data.      Medications: Current Outpatient Medications on File Prior to Visit  Medication Sig Dispense Refill   albuterol (VENTOLIN HFA) 108 (90 Base) MCG/ACT inhaler Inhale 2 puffs into the lungs every 6 (six) hours as needed for wheezing or shortness of breath. 8 g 2   budesonide-formoterol (SYMBICORT) 160-4.5 MCG/ACT inhaler Inhale 2 puffs into the lungs 2 (two) times daily. 1 each 11   montelukast (SINGULAIR) 10 MG tablet TAKE 1 TABLET BY MOUTH EVERY DAY 30 tablet 11   No  current facility-administered medications on file prior to visit.    Review of Systems: No headache, visual changes, nausea, vomiting, diarrhea, constipation, dizziness, abdominal pain, skin rash, fevers, chills, night sweats, swollen lymph nodes, weight loss, chest pain, body aches, joint swelling, muscle aches, shortness of breath, mood changes, visual or auditory hallucinations reported.  Objective:   Vitals:   03/08/22 1532  BP: 118/84  Pulse: 80  SpO2: 98%   Vitals:   03/08/22 1532  Weight: 155 lb 9.6 oz (70.6 kg)  Height: 5\' 6"  (1.676 m)   Body mass index is 25.11 kg/m.  General: Well Developed, well nourished, and in no acute distress.  Neuro: Alert and oriented x3, extra-ocular muscles intact, sensation grossly intact. Cranial nerves II through XII are grossly intact, motor, sensory, and coordinative functions are intact. HEENT: Normocephalic, atraumatic, pupils equal round reactive to light, neck supple, no masses, no lymphadenopathy, thyroid nonpalpable. Oropharynx, nasopharynx, external ear canals are unremarkable. Skin: Warm and dry, no rashes noted.  Cardiac: Regular rate and rhythm, no murmurs rubs or gallops. No peripheral edema. Pulses symmetric. Respiratory: Clear to auscultation bilaterally. Not using accessory muscles, speaking in full sentences. Intermittent cough with position change. Abdominal: Soft, nontender, nondistended, positive bowel sounds, no masses, no organomegaly. Musculoskeletal: Shoulder, elbow, wrist, hip, knee, ankle stable, and with full range of motion.  Female chaperone initials: KG present throughout the physical examination.  Impression and Recommendations:  The patient was counselled, risk factors were discussed, and anticipatory guidance given.  Problem List Items Addressed This Visit       Respiratory   Bronchiectasis without complication (HCC)    Established with pulmonology, symptoms are stable on current regimen including  Symbicort, Singulair, and as needed albuterol.  We will continue to follow peripherally on this issue.        Other   Breast pain, left    Chronic issue with symptoms ongoing greater than 1 year, had previously established with gynecology who recommended imaging and follow-up.  Patient has not had an opportunity to proceed with these yet, these images were ordered again today, referral to gynecology placed for further evaluation and management.      Relevant Orders   MM DIAG BREAST TOMO BILATERAL   US BREAST LTD UNI LEFT INC AXILLA   US BREAST LTD UNI RIGHT INC AXILLA   Annual physical exam - Primary    Annual examination completed, risk stratification labs ordered, anticipatory guidance provided.  We will follow labs once resulted.      Relevant Orders   CBC   Comprehensive metabolic panel   Hepatitis C antibody   HIV Antibody (routine testing w rflx)   Lipid panel   TSH   VITAMIN D 25 Hydroxy (Vit-D Deficiency, Fractures)   Cervical cancer screening    Patient will attempt to obtain outside records from home country of [place], will establish with gynecology.      Relevant Orders   Ambulatory referral to Gynecology   Irregular menses    Longstanding history of irregular menses, amenorrheic since 11/2021 timeframe, referral to gynecology placed for further evaluation & management.      Other Visit Diagnoses     Screening for HIV (human immunodeficiency virus)       Relevant Orders   HIV Antibody (routine testing w rflx)   Need for hepatitis C screening test       Relevant Orders   Hepatitis C antibody   Screening for lipoid disorders       Relevant Orders   Comprehensive metabolic panel   Lipid panel   Low serum vitamin D       Relevant Orders   VITAMIN D 25 Hydroxy (Vit-D Deficiency, Fractures)        Orders & Medications Medications: No orders of the defined types were placed in this encounter.  Orders Placed This Encounter  Procedures   MM DIAG BREAST  TOMO BILATERAL   US BREAST LTD UNI LEFT INC AXILLA   US BREAST LTD UNI RIGHT INC AXILLA   CBC   Comprehensive metabolic panel   Hepatitis C antibody   HIV Antibody (routine testing w rflx)   Lipid panel   TSH   VITAMIN D 25 Hydroxy (Vit-D Deficiency, Fractures)   Ambulatory referral to Gynecology     Return in about 1 year (around 03/09/2023) for CPE.    Jerrol Banana, MD   Primary Care Sports Medicine [facility]

## 2022-03-08 NOTE — Assessment & Plan Note (Signed)
Patient will attempt to obtain outside records from home country of [place], will establish with gynecology.

## 2022-03-08 NOTE — Assessment & Plan Note (Signed)
Longstanding history of irregular menses, amenorrheic since 11/2021 timeframe, referral to gynecology placed for further evaluation & management.

## 2022-03-08 NOTE — Patient Instructions (Addendum)
-   Obtain fasting labs with orders provided (can have water or black coffee but otherwise no food or drink x 8 hours before labs) °- Review information provided °- Attend eye doctor annually, dentist every 6 months, work towards or maintain 30 minutes of moderate intensity physical activity at least 5 days per week, and consume a balanced diet °- Return in 1 year for physical °- Contact us for any questions between now and then °

## 2022-03-08 NOTE — Assessment & Plan Note (Signed)
Chronic issue with symptoms ongoing greater than 1 year, had previously established with gynecology who recommended imaging and follow-up.  Patient has not had an opportunity to proceed with these yet, these images were ordered again today, referral to gynecology placed for further evaluation and management.

## 2022-03-11 ENCOUNTER — Encounter: Payer: Self-pay | Admitting: Internal Medicine

## 2022-03-11 NOTE — Assessment & Plan Note (Addendum)
04/07/21  allergy profile Eos 0.2 / IgE 24   alpha one AT phenotype  MM  Level 143  - 04/07/21 Quant Ig's >>> not done  - 04/07/21 Quant Tb  neg  - 04/07/21  ESR  =  32   She will need longitudinal care as well as extensive records from [place] in [place] so I have referred her to Dr Patsey Berthold in our office here to complete the w/u  - in meantime add empirical gerd rx and f/u with HRCT planned          Each maintenance medication was reviewed in detail including emphasizing most importantly the difference between maintenance and prns and under what circumstances the prns are to be triggered using an action plan format where appropriate.  Total time for H and P, chart review, counseling, reviewing hfa device(s) and generating customized AVS unique to this office visit / same day charting = 30 min

## 2022-03-26 ENCOUNTER — Other Ambulatory Visit
Admission: RE | Admit: 2022-03-26 | Discharge: 2022-03-26 | Disposition: A | Discharge status: Home / Self Care | Payer: 59 | Source: Home / Self Care | Attending: Family Medicine | Admitting: Family Medicine

## 2022-03-26 ENCOUNTER — Ambulatory Visit
Admission: RE | Admit: 2022-03-26 | Discharge: 2022-03-26 | Disposition: A | Discharge status: Home / Self Care | Payer: 59 | Source: Ambulatory Visit | Attending: Family Medicine | Admitting: Family Medicine

## 2022-03-26 DIAGNOSIS — N644 Mastodynia: Secondary | ICD-10-CM | POA: Diagnosis not present

## 2022-03-26 DIAGNOSIS — Z1231 Encounter for screening mammogram for malignant neoplasm of breast: Secondary | ICD-10-CM | POA: Insufficient documentation

## 2022-03-26 LAB — CBC
HCT: 44.3 % (ref 36.0–46.0)
Hemoglobin: 14.8 g/dL (ref 12.0–15.0)
MCH: 28.6 pg (ref 26.0–34.0)
MCHC: 33.4 g/dL (ref 30.0–36.0)
MCV: 85.5 fL (ref 80.0–100.0)
Platelets: 291 10*3/uL (ref 150–400)
RBC: 5.18 MIL/uL — ABNORMAL HIGH (ref 3.87–5.11)
RDW: 13.8 % (ref 11.5–15.5)
WBC: 6.7 10*3/uL (ref 4.0–10.5)
nRBC: 0 % (ref 0.0–0.2)

## 2022-03-26 LAB — LIPID PANEL
Cholesterol: 225 mg/dL — ABNORMAL HIGH (ref 0–200)
HDL: 53 mg/dL (ref >40)
LDL Cholesterol: 156 mg/dL — ABNORMAL HIGH (ref 0–99)
Total CHOL/HDL Ratio: 4.2 RATIO
Triglycerides: 81 mg/dL (ref <150)
VLDL: 16 mg/dL (ref 0–40)

## 2022-03-26 LAB — COMPREHENSIVE METABOLIC PANEL
ALT: 21 U/L (ref 0–44)
AST: 24 U/L (ref 15–41)
Albumin: 4.3 g/dL (ref 3.5–5.0)
Alkaline Phosphatase: 85 U/L (ref 38–126)
Anion gap: 8 (ref 5–15)
BUN: 16 mg/dL (ref 6–20)
CO2: 25 mmol/L (ref 22–32)
Calcium: 9.7 mg/dL (ref 8.9–10.3)
Chloride: 109 mmol/L (ref 98–111)
Creatinine, Ser: 0.77 mg/dL (ref 0.44–1.00)
GFR, Estimated: >60 mL/min (ref >60)
Glucose, Bld: 89 mg/dL (ref 70–99)
Potassium: 4 mmol/L (ref 3.5–5.1)
Sodium: 142 mmol/L (ref 135–145)
Total Bilirubin: 0.6 mg/dL (ref 0.3–1.2)
Total Protein: 8.4 g/dL — ABNORMAL HIGH (ref 6.5–8.1)

## 2022-03-26 LAB — HIV ANTIBODY (ROUTINE TESTING W REFLEX): HIV Screen 4th Generation wRfx: NONREACTIVE

## 2022-03-26 LAB — TSH: TSH: 0.969 u[IU]/mL (ref 0.350–4.500)

## 2022-03-26 LAB — VITAMIN D 25 HYDROXY (VIT D DEFICIENCY, FRACTURES): Vit D, 25-Hydroxy: 25.93 ng/mL — ABNORMAL LOW (ref 30–100)

## 2022-03-26 LAB — HEPATITIS C ANTIBODY: HCV Ab: NONREACTIVE

## 2022-03-30 ENCOUNTER — Other Ambulatory Visit: Payer: Self-pay | Admitting: Family Medicine

## 2022-03-30 DIAGNOSIS — R7989 Other specified abnormal findings of blood chemistry: Secondary | ICD-10-CM

## 2022-03-30 MED ORDER — VITAMIN D (ERGOCALCIFEROL) 1.25 MG (50000 UNIT) PO CAPS: 50000.0000 [IU] | ORAL_CAPSULE | ORAL | 0 refills | Status: DC

## 2022-04-14 ENCOUNTER — Encounter: Payer: Self-pay | Admitting: Family Medicine

## 2022-04-21 ENCOUNTER — Ambulatory Visit: Payer: 59 | Admitting: Obstetrics

## 2022-04-26 ENCOUNTER — Ambulatory Visit: Payer: Self-pay

## 2022-04-26 NOTE — Telephone Encounter (Signed)
     Chief Complaint: Seen in UC 04/16/22 with bronchitis. Still has productive cough. Symptoms: Cough, wheezing Frequency: 04/16/22 Pertinent Negatives: Patient denies fever Disposition: [] ED /[] Urgent Care (no appt availability in office) / [x] Appointment(In office/virtual)/ []  [facility]/ [] Home Care/ [] Refused Recommended Disposition /[] [facility]/ []  Follow-up with PCP Additional Notes:   Reason for Disposition  [1] Continuous (nonstop) coughing interferes with work or school AND [2] no improvement using cough treatment per Care Advice  Answer Assessment - Initial Assessment Questions 1. ONSET: "When did the cough begin?"      Before Thanksgiving 2. SEVERITY: "How bad is the cough today?"      Moderate 3. SPUTUM: "Describe the color of your sputum" (none, dry cough; clear, white, yellow, green)     Yellow 4. HEMOPTYSIS: "Are you coughing up any blood?" If so ask: "How much?" (flecks, streaks, tablespoons, etc.)     No 5. DIFFICULTY BREATHING: "Are you having difficulty breathing?" If Yes, ask: "How bad is it?" (e.g., mild, moderate, severe)    - MILD: No SOB at rest, mild SOB with walking, speaks normally in sentences, can lie down, no retractions, pulse < 100.    - MODERATE: SOB at rest, SOB with minimal exertion and prefers to sit, cannot lie down flat, speaks in phrases, mild retractions, audible wheezing, pulse 100-120.    - SEVERE: Very SOB at rest, speaks in single words, struggling to breathe, sitting hunched forward, retractions, pulse > 120      Mild 6. FEVER: "Do you have a fever?" If Yes, ask: "What is your temperature, how was it measured, and when did it start?"     No 7. CARDIAC HISTORY: "Do you have any history of heart disease?" (e.g., heart attack, congestive heart failure)      No 8. LUNG HISTORY: "Do you have any history of lung disease?"  (e.g., pulmonary embolus, asthma, emphysema)     Asthma 9. PE RISK FACTORS: "Do you have a  history of blood clots?" (or: recent major surgery, recent prolonged travel, bedridden)     No 10. OTHER SYMPTOMS: "Do you have any other symptoms?" (e.g., runny nose, wheezing, chest pain)       Back pain, chest discomfort, wheezing 11. PREGNANCY: "Is there any chance you are pregnant?" "When was your last menstrual period?"       No 12. TRAVEL: "Have you traveled out of the country in the last month?" (e.g., travel history, exposures)       No  Protocols used: Cough - Acute Productive-A-AH

## 2022-04-27 ENCOUNTER — Ambulatory Visit: Payer: 59 | Admitting: Family Medicine

## 2022-05-30 ENCOUNTER — Emergency Department
Admission: EM | Admit: 2022-05-30 | Discharge: 2022-05-30 | Disposition: A | Discharge status: Home / Self Care | Payer: 59 | Attending: Emergency Medicine | Admitting: Emergency Medicine

## 2022-05-30 ENCOUNTER — Emergency Department: Payer: 59

## 2022-05-30 ENCOUNTER — Other Ambulatory Visit: Payer: Self-pay

## 2022-05-30 DIAGNOSIS — Z7951 Long term (current) use of inhaled steroids: Secondary | ICD-10-CM | POA: Diagnosis not present

## 2022-05-30 DIAGNOSIS — R0602 Shortness of breath: Secondary | ICD-10-CM | POA: Insufficient documentation

## 2022-05-30 DIAGNOSIS — J45909 Unspecified asthma, uncomplicated: Secondary | ICD-10-CM | POA: Insufficient documentation

## 2022-05-30 DIAGNOSIS — J029 Acute pharyngitis, unspecified: Secondary | ICD-10-CM | POA: Diagnosis not present

## 2022-05-30 DIAGNOSIS — R079 Chest pain, unspecified: Secondary | ICD-10-CM | POA: Diagnosis present

## 2022-05-30 DIAGNOSIS — Z20822 Contact with and (suspected) exposure to covid-19: Secondary | ICD-10-CM | POA: Insufficient documentation

## 2022-05-30 DIAGNOSIS — R053 Chronic cough: Secondary | ICD-10-CM

## 2022-05-30 LAB — CBC WITH DIFFERENTIAL/PLATELET
Abs Immature Granulocytes: 0.06 10*3/uL (ref 0.00–0.07)
Basophils Absolute: 0.1 10*3/uL (ref 0.0–0.1)
Basophils Relative: 1 %
Eosinophils Absolute: 0.2 10*3/uL (ref 0.0–0.5)
Eosinophils Relative: 2 %
HCT: 43.5 % (ref 36.0–46.0)
Hemoglobin: 14.2 g/dL (ref 12.0–15.0)
Immature Granulocytes: 1 %
Lymphocytes Relative: 33 %
Lymphs Abs: 3.6 10*3/uL (ref 0.7–4.0)
MCH: 28.3 pg (ref 26.0–34.0)
MCHC: 32.6 g/dL (ref 30.0–36.0)
MCV: 86.8 fL (ref 80.0–100.0)
Monocytes Absolute: 0.6 10*3/uL (ref 0.1–1.0)
Monocytes Relative: 6 %
Neutro Abs: 6.3 10*3/uL (ref 1.7–7.7)
Neutrophils Relative %: 57 %
Platelets: 311 10*3/uL (ref 150–400)
RBC: 5.01 MIL/uL (ref 3.87–5.11)
RDW: 14 % (ref 11.5–15.5)
WBC: 10.8 10*3/uL — ABNORMAL HIGH (ref 4.0–10.5)
nRBC: 0 % (ref 0.0–0.2)

## 2022-05-30 LAB — URINALYSIS, ROUTINE W REFLEX MICROSCOPIC
Bacteria, UA: NONE SEEN
Bilirubin Urine: NEGATIVE
Glucose, UA: NEGATIVE mg/dL
Hgb urine dipstick: NEGATIVE
Ketones, ur: NEGATIVE mg/dL
Nitrite: NEGATIVE
Protein, ur: NEGATIVE mg/dL
Specific Gravity, Urine: 1.01 (ref 1.005–1.030)
pH: 7 (ref 5.0–8.0)

## 2022-05-30 LAB — COMPREHENSIVE METABOLIC PANEL
ALT: 29 U/L (ref 0–44)
AST: 42 U/L — ABNORMAL HIGH (ref 15–41)
Albumin: 4.3 g/dL (ref 3.5–5.0)
Alkaline Phosphatase: 107 U/L (ref 38–126)
Anion gap: 10 (ref 5–15)
BUN: 18 mg/dL (ref 6–20)
CO2: 24 mmol/L (ref 22–32)
Calcium: 9.4 mg/dL (ref 8.9–10.3)
Chloride: 103 mmol/L (ref 98–111)
Creatinine, Ser: 0.68 mg/dL (ref 0.44–1.00)
GFR, Estimated: >60 mL/min (ref >60)
Glucose, Bld: 105 mg/dL — ABNORMAL HIGH (ref 70–99)
Potassium: 3.8 mmol/L (ref 3.5–5.1)
Sodium: 137 mmol/L (ref 135–145)
Total Bilirubin: 0.6 mg/dL (ref 0.3–1.2)
Total Protein: 8.4 g/dL — ABNORMAL HIGH (ref 6.5–8.1)

## 2022-05-30 LAB — RESP PANEL BY RT-PCR (RSV, FLU A&B, COVID)  RVPGX2
Influenza A by PCR: NEGATIVE
Influenza B by PCR: NEGATIVE
Resp Syncytial Virus by PCR: NEGATIVE
SARS Coronavirus 2 by RT PCR: NEGATIVE

## 2022-05-30 LAB — D-DIMER, QUANTITATIVE: D-Dimer, Quant: 1.07 ug/mL-FEU — ABNORMAL HIGH (ref 0.00–0.50)

## 2022-05-30 LAB — TROPONIN I (HIGH SENSITIVITY)
Troponin I (High Sensitivity): <2 ng/L (ref <18)
Troponin I (High Sensitivity): 3 ng/L (ref <18)

## 2022-05-30 LAB — POC URINE PREG, ED: Preg Test, Ur: NEGATIVE

## 2022-05-30 LAB — GROUP A STREP BY PCR: Group A Strep by PCR: NOT DETECTED

## 2022-05-30 MED ORDER — SODIUM CHLORIDE 0.9 % IV BOLUS (SEPSIS)
1000.0000 mL | Freq: Once | INTRAVENOUS | Status: AC
  Administered 2022-05-30: 1000 mL via INTRAVENOUS

## 2022-05-30 MED ORDER — KETOROLAC TROMETHAMINE 15 MG/ML IJ SOLN
15.0000 mg | Freq: Once | INTRAMUSCULAR | Status: AC
  Administered 2022-05-30: 15 mg via INTRAVENOUS
  Filled 2022-05-30: qty 1

## 2022-05-30 MED ORDER — IOHEXOL 350 MG/ML SOLN
75.0000 mL | Freq: Once | INTRAVENOUS | Status: AC | PRN
  Administered 2022-05-30: 75 mL via INTRAVENOUS

## 2022-05-30 MED ORDER — ALUM & MAG HYDROXIDE-SIMETH 200-200-20 MG/5ML PO SUSP
30.0000 mL | Freq: Once | ORAL | Status: AC
  Administered 2022-05-30: 30 mL via ORAL
  Filled 2022-05-30: qty 30

## 2022-05-30 MED ORDER — LIDOCAINE 5 % EX PTCH
1.0000 | MEDICATED_PATCH | CUTANEOUS | Status: DC
  Administered 2022-05-30: 1 via TRANSDERMAL
  Filled 2022-05-30: qty 1

## 2022-05-30 MED ORDER — ACETAMINOPHEN 500 MG PO TABS
1000.0000 mg | ORAL_TABLET | Freq: Once | ORAL | Status: AC
  Administered 2022-05-30: 1000 mg via ORAL
  Filled 2022-05-30: qty 2

## 2022-05-30 NOTE — Discharge Instructions (Addendum)
You were seen in the emergency department for chest pain.  On CT scan you did not have any findings concerning for a blood clot in your lungs.  You had findings of chronic inflammation which you are being followed by pulmonology for.  It is importantly keep your appointment and follow-up with pulmonology on January 25.  Return to the emergency department for any fever or worsening symptoms.  Pain control:  Ibuprofen (motrin/aleve/advil) - You can take 3-4 tablets (600-800 mg) every 6 hours as needed for pain/fever.  Acetaminophen (tylenol) - You can take 2 extra strength tablets (1000 mg) every 6 hours as needed for pain/fever.  You can alternate these medications or take them together.  Make sure you eat food/drink water when taking these medications.

## 2022-05-30 NOTE — ED Provider Notes (Addendum)
Care assumed of patient from outgoing provider.  See their note for initial history, exam and plan.  Clinical Course as of 05/30/22 7672  Nancy Fetter May 30, 2022  0702 Sudden onset of chest pain and dyspnea. Trop neg x 2, dimer +, CTA pending.  [SM]  0710 CT scan with no PE but concern for possible atypical mycobacterial infection.  Consulting infectious disease for treatment and work up [SM]    Clinical Course User Index [SM] Nathaniel Man, MD   On chart review patient is followed by pulmonology and being worked up for a mycobacterial infection as an outpatient.  Discussed with infectious disease who did not recommend any further workup from the emergency department at this time.  Recommended following up with pulmonology and discussing with pulmonology.  Discussed the patient's case with pulmonologist on-call who recommended follow-up with [facility] pulmonology which she has scheduled on January 25.  Did not feel that the patient needed further workup or treatment from the emergency department.  Patient requesting to send CT imaging to her pulmonologist Dr. Patsey Berthold.  Message sent to Dr. Patsey Berthold.  Discussed NSAIDs for pain control.  Given return precautions for any worsening symptoms.    Nathaniel Man, MD 05/30/22 1146

## 2022-05-30 NOTE — ED Notes (Signed)
Pt got up and walked to hall bathroom. Steady gait noted. No dizziness or SOB.

## 2022-05-30 NOTE — ED Notes (Signed)
Patient transported to CT 

## 2022-05-30 NOTE — ED Provider Triage Note (Signed)
Emergency Medicine Provider Triage Evaluation Note  Jeanette Sandoval , a 37 y.o. female  was evaluated in triage.  Pt complains of CP.  Woke her up from sleep, burning sensation with sore throat. No SOB- no risk factors PE  Review of Systems  Positive: Sore throat  Negative: Abd pain   Physical Exam  BP 120/85   Pulse 97   Temp 97.8 F (36.6 C) (Oral)   Resp (!) 27 Comment: Pt crying  Ht 5\' 4"  (1.626 m)   Wt 77.1 kg   LMP 05/15/2022 (Exact Date)   SpO2 99%   BMI 29.18 kg/m  Gen:   Awake, no distress   Resp:  Normal effort  MSK:   Moves extremities without difficulty  Other:  No chest wall tenderness   Medical Decision Making  Medically screening exam initiated at 5:34 AM.  Appropriate orders placed.  Jeanette Sandoval was informed that the remainder of the evaluation will be completed by another provider, this initial triage assessment does not replace that evaluation, and the importance of remaining in the ED until their evaluation is complete.  Labs, xray    Jeanette Cowan, MD 05/30/22 [id number]

## 2022-05-30 NOTE — ED Provider Notes (Signed)
[facility] Provider Note    Event Date/Time   First MD Initiated Contact with Patient 05/30/22 [phone]     (approximate)   History   Chest Pain   HPI  Jeanette Sandoval is a 37 y.o. female with history of asthma, bronchiectasis who presents to the emergency department with sudden onset chest pain and shortness of breath that woke her from sleep.  Chest pain is mostly in the left side of her chest and radiates under her arm.  She does have a pleuritic component to her chest pain.  No fevers or cough.  No lower extremity swelling or pain.  No history of PE or DVT.  She took ibuprofen at home and used her inhaler without relief.  Was given Tylenol in triage.  Also complaining of some sore throat as well.   History provided by patient.    Past Medical History:  Diagnosis Date   Asthma    Bronchiectasis [facility])     Past Surgical History:  Procedure Laterality Date   APPENDECTOMY  1999   CESAREAN SECTION  2019   NASAL SINUS SURGERY  2014   WISDOM TOOTH EXTRACTION Bilateral 11/2020    MEDICATIONS:  Prior to Admission medications   Medication Sig Start Date End Date Taking? Authorizing Provider  albuterol (VENTOLIN HFA) 108 (90 Base) MCG/ACT inhaler Inhale 2 puffs into the lungs every 6 (six) hours as needed for wheezing or shortness of breath. 10/16/21   Salena Saner, MD  budesonide-formoterol [facility]) 160-4.5 MCG/ACT inhaler Inhale 2 puffs into the lungs 2 (two) times daily. 10/16/21   Salena Saner, MD  montelukast (SINGULAIR) 10 MG tablet TAKE 1 TABLET BY MOUTH EVERY DAY 02/10/22   Salena Saner, MD  Vitamin D, Ergocalciferol, (DRISDOL) 1.25 MG (50000 UNIT) CAPS capsule Take 1 capsule (50,000 Units total) by mouth every 7 (seven) days. Take for 8 total doses(weeks) 03/30/22   Jerrol Banana, MD    Physical Exam   Triage Vital Signs: ED Triage Vitals  Enc Vitals Group     BP 05/30/22 0225 120/85     Pulse Rate 05/30/22 0224 97      Resp 05/30/22 0224 (!) 27     Temp 05/30/22 0224 97.8 F (36.6 C)     Temp Source 05/30/22 0224 Oral     SpO2 05/30/22 0224 99 %     Weight 05/30/22 0225 170 lb (77.1 kg)     Height 05/30/22 0225 5\' 4"  (1.626 m)     Head Circumference --      Peak Flow --      Pain Score 05/30/22 0225 9     Pain Loc --      Pain Edu? --      Excl. in GC? --     Most recent vital signs: Vitals:   05/30/22 0538 05/30/22 0701  BP: 97/65 (!) 98/54  Pulse: 74 84  Resp: 18 16  Temp: 97.8 F (36.6 C)   SpO2: 96% 100%    CONSTITUTIONAL: Alert and oriented and responds appropriately to questions. Well-appearing; well-nourished HEAD: Normocephalic, atraumatic EYES: Conjunctivae clear, pupils appear equal, sclera nonicteric ENT: normal nose; moist mucous membranes NECK: Supple, normal ROM CARD: RRR; S1 and S2 appreciated; no murmurs, no clicks, no rubs, no gallops RESP: Normal chest excursion without splinting or tachypnea; breath sounds clear and equal bilaterally; no wheezes, no rhonchi, no rales, no hypoxia or respiratory distress, speaking full sentences ABD/GI: Normal bowel sounds; non-distended;  soft, non-tender, no rebound, no guarding, no peritoneal signs BACK: The back appears normal EXT: Normal ROM in all joints; no deformity noted, no edema; no cyanosis SKIN: Normal color for age and race; warm; no rash on exposed skin NEURO: Moves all extremities equally, normal speech PSYCH: The patient's mood and manner are appropriate.   ED Results / Procedures / Treatments   LABS: (all labs ordered are listed, but only abnormal results are displayed) Labs Reviewed  CBC WITH DIFFERENTIAL/PLATELET - Abnormal; Notable for the following components:      Result Value   WBC 10.8 (*)    All other components within normal limits  COMPREHENSIVE METABOLIC PANEL - Abnormal; Notable for the following components:   Glucose, Bld 105 (*)    Total Protein 8.4 (*)    AST 42 (*)    All other components within  normal limits  URINALYSIS, ROUTINE W REFLEX MICROSCOPIC - Abnormal; Notable for the following components:   Color, Urine YELLOW (*)    APPearance CLEAR (*)    Leukocytes,Ua SMALL (*)    All other components within normal limits  D-DIMER, QUANTITATIVE - Abnormal; Notable for the following components:   D-Dimer, Quant 1.07 (*)    All other components within normal limits  RESP PANEL BY RT-PCR (RSV, FLU A&B, COVID)  [id number]  GROUP A STREP BY PCR  POC URINE PREG, ED  TROPONIN I (HIGH SENSITIVITY)  TROPONIN I (HIGH SENSITIVITY)     EKG:  EKG Interpretation  Date/Time:  Sunday May 30 2022 02:23:40 EST Ventricular Rate:  97 PR Interval:  142 QRS Duration: 82 QT Interval:  362 QTC Calculation: 459 R Axis:   66 Text Interpretation: Normal sinus rhythm Normal ECG No previous ECGs available Confirmed by Pryor Curia [phone]) on 05/30/2022 6:08:43 AM         RADIOLOGY: My personal review and interpretation of imaging: Chest x-ray clear.  CTA chest pending.  I have personally reviewed all radiology reports.   CT Angio Chest PE W and/or Wo Contrast  Result Date: 05/30/2022 CLINICAL DATA:  Chest pain. Pulmonary embolism suspected, low to intermediate probability. D-dimer is patent. EXAM: CT ANGIOGRAPHY CHEST WITH CONTRAST TECHNIQUE: Multidetector CT imaging of the chest was performed using the standard protocol during bolus administration of intravenous contrast. Multiplanar CT image reconstructions and MIPs were obtained to evaluate the vascular anatomy. RADIATION DOSE REDUCTION: This exam was performed according to the departmental dose-optimization program which includes automated exposure control, adjustment of the mA and/or kV according to patient size and/or use of iterative reconstruction technique. CONTRAST:  38mL OMNIPAQUE IOHEXOL 350 MG/ML SOLN COMPARISON:  Prior CT scan of the chest 05/06/2021 FINDINGS: Cardiovascular: Excellent opacification of the pulmonary arteries to the  segmental level. No evidence of pulmonary embolus. The main pulmonary artery is at the upper limits of normal at 3 cm in diameter. Conventional 3 vessel aortic arch anatomy. No evidence of aneurysm, dissection or atherosclerotic plaque. The heart is at the upper limits of normal for size. No pericardial effusion. Mediastinum/Nodes: Unremarkable CT appearance of the thyroid gland. Prominent nodal tissue. Right hilar lymph node tissue measures 1.3 cm in diameter. Right infrahilar nodal tissue measures up to 1.6 cm in diameter. Both nodal stations are incrementally enlarged compared to prior imaging from December of 2022. No soft tissue mediastinal mass. The thoracic esophagus is unremarkable. Lungs/Pleura: Advanced chronic bronchiectasis with dense fibrotic consolidation of the medial aspect of the middle lobe, and the lingula. Slight interval progression of tree-in-bud micro  nodularity and peribronchovascular ground-glass attenuation airspace opacities within the periphery of the right upper lobe and the anterior aspect of the right lower lobe. Overall, inspiratory volumes are lower than seen on the prior study. No pneumothorax or pleural effusion. Upper Abdomen: No acute abnormality. Incidentally noted replaced left hepatic artery to the left gastric artery. Musculoskeletal: No acute fracture or aggressive appearing lytic or blastic osseous lesion. Review of the MIP images confirms the above findings. IMPRESSION: 1. Negative for acute pulmonary embolus. 2. Interval progression of multifocal tree-in-bud micro nodularity and peribronchovascular ground-glass attenuation airspace opacities compared to prior imaging from December of 2022. 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 3. Similar appearance of chronic fibrotic consolidation with associated bronchiectasis in the medial aspects of the lingula and right middle lobe. 4. Enlarged main pulmonary artery at 3  cm suggests pulmonary arterial hypertension. 5. Slightly increased prominence of mediastinal and right hilar nodal tissue which is almost certainly reactive to the chronic infectious/inflammatory process involving the lungs. Electronically Signed   By: Malachy Moan M.D.   On: 05/30/2022 07:01   DG Chest 2 View  Result Date: 05/30/2022 CLINICAL DATA:  Chest pain EXAM: CHEST - 2 VIEW COMPARISON:  08/23/2021 FINDINGS: Lingular and right middle lobe opacity with bronchiectasis that is chronic. No new or progressive airspace opacity. No edema, effusion, or pneumothorax. Normal heart size. IMPRESSION: 1. No acute finding when compared to prior. 2. Lingular and right middle lobe chronic opacity with bronchiectasis. Electronically Signed   By: Tiburcio Pea M.D.   On: 05/30/2022 04:59     PROCEDURES:  Critical Care performed: No      .1-3 Lead EKG Interpretation  Performed by: Kron Everton, Layla Maw, DO Authorized by: Gleen Ripberger, Layla Maw, DO     Interpretation: normal     ECG rate:  74   ECG rate assessment: normal     Rhythm: sinus rhythm     Ectopy: none     Conduction: normal       IMPRESSION / MDM / ASSESSMENT AND PLAN / ED COURSE  I reviewed the triage vital signs and the nursing notes.    Patient here with sudden onset chest pain and shortness of breath that woke her from sleep.  The patient is on the cardiac monitor to evaluate for evidence of arrhythmia and/or significant heart rate changes.   DIFFERENTIAL DIAGNOSIS (includes but not limited to):   ACS, PE, pneumonia, viral URI, pneumothorax, pleurisy, musculoskeletal pain, esophageal spasm, GERD, doubt dissection   Patient's presentation is most consistent with acute presentation with potential threat to life or bodily function.   PLAN: Workup initiated from triage.  Patient does have a slight leukocytosis with no left shift.  Normal electrolytes, renal function.  First troponin negative.  Second pending.  Will also obtain  D-dimer given pleuritic component of chest pain with intermittent tachypnea here.  COVID, flu and RSV and strep swabs pending.  Chest x-ray reviewed and interpreted by myself and the radiologist and shows chronic bronchiectasis without acute findings.   MEDICATIONS GIVEN IN ED: Medications  lidocaine (LIDODERM) 5 % 1 patch (1 patch Transdermal Patch Applied 05/30/22 0545)  acetaminophen (TYLENOL) tablet 1,000 mg (1,000 mg Oral Given 05/30/22 0543)  alum & mag hydroxide-simeth (MAALOX/MYLANTA) 200-200-20 MG/5ML suspension 30 mL (30 mLs Oral Given 05/30/22 0544)  sodium chloride 0.9 % bolus 1,000 mL (1,000 mLs Intravenous New Bag/Given 05/30/22 0628)  iohexol (OMNIPAQUE) 350 MG/ML injection 75 mL (75 mLs  Intravenous Contrast Given 05/30/22 0649)     ED COURSE: Patient's COVID, flu, RSV and strep swabs were negative.  D-dimer elevated.  Patient agrees to proceed with CTA of the chest.  Pregnancy test negative.  Will give IV fluids.   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  No other acute opacities seen.  No cavitation.  No known risk factors for tuberculosis but will question further.  Will discuss with infectious disease for further recommendations.  Signed out to oncoming ED physician.   CONSULTS: Dispo pending ID recommendations.   OUTSIDE RECORDS REVIEWED: Reviewed patient's last internal medicine visit on 04/16/2022.       FINAL CLINICAL IMPRESSION(S) / ED DIAGNOSES   Final diagnoses:  Nonspecific chest pain     Rx / DC Orders   ED Discharge Orders     None        Note:  This document was prepared using Dragon voice recognition software and may include unintentional dictation errors.   Amiyrah Lamere, Layla Maw, DO 05/30/22 [phone]

## 2022-05-30 NOTE — ED Notes (Signed)
Lab to run d-dimer from previously collected blood.

## 2022-05-30 NOTE — ED Notes (Signed)
Provided ice water. Pt requesting pain med for slight CP again. Informed pt EDP will be back to see her once have talked to ID regarding chest CT findings.

## 2022-05-30 NOTE — ED Triage Notes (Signed)
Pt arrives via POV with CC of central CP that goes into back. Pt reports pain woke her from sleep. Denies dizziness and lightheadedness at this time. Pt is crying but redirectable at this time.

## 2022-05-31 ENCOUNTER — Telehealth: Payer: Self-pay

## 2022-05-31 NOTE — Telephone Encounter (Signed)
Transition Care Management Follow-up Telephone Call Date of discharge and from where: 05/30/2022 [facility] How have you been since you were released from the hospital? Still in pain, but not as bad as it was Any questions or concerns? No  Items Reviewed: Did the pt receive and understand the discharge instructions provided? Yes  Medications obtained and verified? Yes  Other? No  Any new allergies since your discharge? No  Dietary orders reviewed? Yes Do you have support at home? Yes   Home Care and Equipment/Supplies: none  Functional Questionnaire: (I = Independent and D = Dependent) ADLs: I  Bathing/Dressing- I  Meal Prep- I  Eating- I  Maintaining continence- I  Transferring/Ambulation- I  Managing Meds- I  Follow up appointments reviewed:  PCP Hospital f/u appt confirmed? Yes  Scheduled to see matthews on 06/08/22 @ 220. [facility] f/u appt confirmed? Yes  Scheduled to see Dr Patsey Berthold on 06/07/22 @ 10:30. Are transportation arrangements needed? No  If their condition worsens, is the pt aware to call PCP or go to the Emergency Dept.? Yes Was the patient provided with contact information for the PCP's office or ED? Yes Was to pt encouraged to call back with questions or concerns? Yes

## 2022-06-07 ENCOUNTER — Ambulatory Visit: Payer: 59 | Admitting: Pulmonary Disease

## 2022-06-07 ENCOUNTER — Encounter: Payer: Self-pay | Admitting: Pulmonary Disease

## 2022-06-07 VITALS — BP 118/70 | HR 78 | Temp 98.1°F | Ht 64.0 in | Wt 157.0 lb

## 2022-06-07 DIAGNOSIS — K219 Gastro-esophageal reflux disease without esophagitis: Secondary | ICD-10-CM

## 2022-06-07 DIAGNOSIS — J471 Bronchiectasis with (acute) exacerbation: Secondary | ICD-10-CM | POA: Diagnosis not present

## 2022-06-07 DIAGNOSIS — J4540 Moderate persistent asthma, uncomplicated: Secondary | ICD-10-CM

## 2022-06-07 DIAGNOSIS — R002 Palpitations: Secondary | ICD-10-CM | POA: Diagnosis not present

## 2022-06-07 DIAGNOSIS — R0602 Shortness of breath: Secondary | ICD-10-CM

## 2022-06-07 MED ORDER — PANTOPRAZOLE SODIUM 40 MG PO TBEC: 40.0000 mg | DELAYED_RELEASE_TABLET | Freq: Every day | ORAL | 3 refills | Status: DC

## 2022-06-07 MED ORDER — DOXYCYCLINE HYCLATE 100 MG PO TABS: 100.0000 mg | ORAL_TABLET | Freq: Two times a day (BID) | ORAL | 0 refills | Status: DC

## 2022-06-07 NOTE — Patient Instructions (Addendum)
We are referring you to the heart doctor to evaluate your palpitations.  We are also getting an echocardiogram to check your heart and make sure this is not causing your shortness of breath.  You will start a tablet to take daily to see if the pain you are feeling is from esophageal spasm.  This is called Protonix (pantoprazole).  I have sent in a prescription for an antibiotic to take for a weeks time.  Make sure that you are not outside in the sun with prolonged exposure as the antibiotic can sometimes cause "red skin" if there is significant sun exposure.  We are ordering another sputum specimen.  We will see him in follow-up in 4 to 6 weeks time call sooner should any new problems arise.   La remitimos al cardilogo para que evale sus palpitaciones. Tambin le realizaremos un ecocardiograma para estudiar su corazn y asegurarnos de que esto no est causando su dificultad para Ambulance person.  Comenzar a tomar una tableta diariamente para ver si el dolor que siente se debe a un espasmo esofgico. Esto se llama Protonix (pantoprazol).  Le envi una receta para un antibitico que debe tomarse por [employer]. Asegrese de no estar afuera al sol con una exposicin prolongada, ya que el antibitico a veces puede causar "piel roja" si hay una exposicin significativa al sol.  Estamos solicitando otra muestra de esputo.  Lo veremos en seguimiento dentro de 4 a 6 semanas, llame antes si surge algn problema nuevo.

## 2022-06-07 NOTE — Progress Notes (Addendum)
Subjective:    Patient ID: Jeanette Sandoval, female    DOB: 1986/01/09, 37 y.o.   MRN: [id number] Patient Care Team: Montel Culver, MD as PCP - General [facility])  Chief Complaint  Patient presents with   Follow-up    Cough with greenish sputum. SOB with simple task, like getting dressed and making her bed. Wheezing.    HPI Patient is a 37 year old lifelong never smoker, native of [place], who follows here for the issue of bronchiectasis.  This is a scheduled visit.  Last visit with me was 21 January 2022.  Since that visit she presented to the emergency room on 7 January with complaint of chest pain. CT angio chest that showed possible mild enlargement of the pulmonary artery.  No PE.  She had the previously noted bronchiectatic changes.  There was some tree-in-bud micro nodularity and groundglass attenuation airspace opacities which were consistent with an acute infectious process.  I suspect that this was due to a bronchiectasis flare.  She did not receive any antibiotics.    Recall that she has had extensive work-up for her bronchiectasis including bronchoscopy in the past (in [place]).  She has had negative sputum collected here for regular culture and MAI.  Prior negative QuantiFERON gold.  She does note that since she started vest physiotherapy her cough has pretty much resolved and her sputum production is more manageable.  She has not had any recent fevers, chills or sweats since her sit to the emergency room.  Her shortness of breath lingers.  No other new symptomatology.   She has not needed rescue inhaler lately.  She tells me that she is trying to use her vest at least once a day however I am doubtful that she is using it consistently.  Review of Systems A 10 point review of systems was performed and it is as noted above otherwise negative.  Patient Active Problem List   Diagnosis Date Noted   Annual physical exam 03/08/2022   Cervical cancer screening 03/08/2022    Irregular menses 03/08/2022   Malar rash 03/02/2021   Bronchiectasis without complication ([place]) 69/67/8938   Allergic rhinitis 03/02/2021   Breast pain, left 03/02/2021   Social History   Tobacco Use   Smoking status: Never   Smokeless tobacco: Never  Substance Use Topics   Alcohol use: Not Currently   No Known Allergies  Current Meds  Medication Sig   albuterol (VENTOLIN HFA) 108 (90 Base) MCG/ACT inhaler Inhale 2 puffs into the lungs every 6 (six) hours as needed for wheezing or shortness of breath.   budesonide-formoterol (SYMBICORT) 160-4.5 MCG/ACT inhaler Inhale 2 puffs into the lungs 2 (two) times daily.   montelukast (SINGULAIR) 10 MG tablet TAKE 1 TABLET BY MOUTH EVERY DAY   Vitamin D, Ergocalciferol, (DRISDOL) 1.25 MG (50000 UNIT) CAPS capsule Take 1 capsule (50,000 Units total) by mouth every 7 (seven) days. Take for 8 total doses(weeks)   Immunization History  Administered Date(s) Administered   Influenza-Unspecified 02/25/2021, 03/03/2022   Pfizer Covid-19 Vaccine Bivalent Booster 86yr & up 06/10/2020       Objective:   Physical Exam BP 118/70 (BP Location: Left Arm, Cuff Size: Normal)   Pulse 78   Temp 98.1 F (36.7 C)   Ht _0  (1.626 m)   Wt 157 lb (71.2 kg)   LMP 05/15/2022 (Exact Date)   SpO2 98%   BMI 26.95 kg/m   SpO2: 98 % O2 Device: None (Room air)  GENERAL: Well-developed,  well-nourished woman, no acute distress.  Fully ambulatory.  No conversational dyspnea HEAD: Normocephalic, atraumatic.  EYES: Pupils equal, round, reactive to light.  No scleral icterus.  MOUTH: Dentition intact, no mucosal abnormalities. NECK: Supple. No thyromegaly. Trachea midline. No JVD.  No adenopathy. PULMONARY: Good air entry bilaterally.  No adventitious sounds.   CARDIOVASCULAR: S1 and S2. Regular rate and rhythm.  No rubs, murmurs or gallops heard. ABDOMEN: Benign. MUSCULOSKELETAL: No joint deformity, no clubbing, no edema.  NEUROLOGIC: No overt focal  deficit, no gait disturbance, speech is fluent. SKIN: Intact,warm,dry. PSYCH: Mood and behavior normal.  Ambulatory oximetry was performed today: At rest oxygen saturation is 98%, resting heart rate 88 bpm.  O2 nadir was 95%.  Patient was able to ambulate 750 feet.  Peak heart rate at 103.    Assessment & Plan:     ICD-10-CM   1. Shortness of breath  R06.02 ECHOCARDIOGRAM COMPLETE   Will exclude potential cardiac etiology with 2D echo Ambulatory oximetry reassuring    2. Bronchiectasis with (acute) exacerbation (HCC)  J47.1 Respiratory or Resp and Sputum Culture   Doxycycline 100 mg twice daily x 7 days Collect sputum prior to starting antibiotic Routine C&S, AFB (query MAC)    3. Moderate persistent asthma without complication  123456    Continue Symbicort and Singulair Continue as needed albuterol    4. Palpitations  R00.2 Ambulatory referral to Cardiology   Referral to cardiology for evaluation    5. Gastroesophageal reflux disease, unspecified whether esophagitis present  K21.9    Trial of Protonix 40 mg daily Query esophageal spasm associated with GERD     Orders Placed This Encounter  Procedures   Respiratory or Resp and Sputum Culture    Standing Status:   Future    Standing Expiration Date:   06/08/2023   Ambulatory referral to Cardiology    Referral Priority:   Routine    Referral Type:   Consultation    Referral Reason:   Specialty Services Required    Requested Specialty:   Cardiology    Number of Visits Requested:   1   ECHOCARDIOGRAM COMPLETE    Standing Status:   Future    Standing Expiration Date:   06/08/2023    Order Specific Question:   Where should this test be performed    Answer:   [facility] [place]    Order Specific Question:   Perflutren DEFINITY (image enhancing agent) should be administered unless hypersensitivity or allergy exist    Answer:   Administer Perflutren    Order Specific Question:   Reason for exam-Echo    Answer:   Dyspnea   R06.00   Meds ordered this encounter  Medications   pantoprazole (PROTONIX) 40 MG tablet    Sig: Take 1 tablet (40 mg total) by mouth daily.    Dispense:  30 tablet    Refill:  3   doxycycline (VIBRA-TABS) 100 MG tablet    Sig: Take 1 tablet (100 mg total) by mouth 2 (two) times daily.    Dispense:  14 tablet    Refill:  0   Yafa has been encouraged to use her vest at least twice a day.  This should help her stay clear with regards to her issues with bronchiectasis.  We will get a 2D echo to rule out potential pulmonary hypertension as cause for her dyspnea and chest discomfort.  Additionally she has some issues that may be consistent with esophageal spasm and we will give  her a trial of PPI.  Will recollect sputum, recall that she has had negative sputum before including negative for AFB.  We will reconvene in 4 to 6 weeks time, she is to contact us prior to that time should any new difficulties arise.  Renold Don, MD Advanced Bronchoscopy PCCM [facility]    *This note was dictated using voice recognition software/Dragon.  Despite best efforts to proofread, errors can occur which can change the meaning. Any transcriptional errors that result from this process are unintentional and may not be fully corrected at the time of dictation.

## 2022-06-08 ENCOUNTER — Ambulatory Visit: Payer: 59 | Admitting: Family Medicine

## 2022-06-08 ENCOUNTER — Encounter: Payer: Self-pay | Admitting: Family Medicine

## 2022-06-08 VITALS — BP 110/70 | HR 98 | Ht 65.35 in | Wt 158.0 lb

## 2022-06-08 DIAGNOSIS — F419 Anxiety disorder, unspecified: Secondary | ICD-10-CM | POA: Diagnosis not present

## 2022-06-08 DIAGNOSIS — F32A Depression, unspecified: Secondary | ICD-10-CM | POA: Diagnosis not present

## 2022-06-08 DIAGNOSIS — R079 Chest pain, unspecified: Secondary | ICD-10-CM

## 2022-06-08 DIAGNOSIS — R072 Precordial pain: Secondary | ICD-10-CM

## 2022-06-08 HISTORY — DX: Chest pain, unspecified: R07.9

## 2022-06-08 MED ORDER — SERTRALINE HCL 25 MG PO TABS: 25.0000 mg | ORAL_TABLET | Freq: Every day | ORAL | 1 refills | Status: DC

## 2022-06-08 NOTE — Assessment & Plan Note (Signed)
When reviewing differential for substernal pain we discussed concern over anxiety/depression.  GAD and PHQ scores reviewed and do raise concern for the same.  We discussed both pharmacologic and nonpharmacologic treatment strategies, is amenable to initiation of sertraline 25 mg daily, we will coordinate 32-month follow-up.  I have also encouraged nonpharmacologic management with patient education provided via MyChart.

## 2022-06-08 NOTE — Patient Instructions (Addendum)
-  Start sertraline daily - Continue pantoprazole daily, take on empty stomach 30 minutes before meal - Review information on dietary changes for reflux (GERD) and stress reduction - Maintain follow-up with cardiology -Please contact gynecology for cervical cancer screening (Pap smear) using number below: [facility] [place]  -Return for follow-up in 2 months

## 2022-06-08 NOTE — Assessment & Plan Note (Signed)
Noting proceeding ER visit on 05/30/2022, see overview for ER HPI/plan.  Patient has since established with her pulmonologist will refer to cardiology, advised Protonix dosing.  Additionally concern over anxiety/depression raised, see additional assessment(s) for plan details.  Cardiac exam with positive S1 and S2, regular rate and rhythm, no additional heart sounds, there is tenderness about the parasternal regions which do not necessarily recreate her stated symptomatology, hypoactive bowel sounds without hepatosplenomegaly, mild tenderness at the epigastric region, lung fields demonstrate air entry throughout with prolonged expiratory phase, no wheezes, rales, rhonchi.  Discussed differential for substernal chest pain symptoms inclusive of cardiac, gastrointestinal, pulmonary, musculoskeletal, and psychiatric.  She does have upcoming cardiology referral, I have advised patient to continue pantoprazole until she sees cardiology, dietary modifications for GERD related as well, and we will initiate SSRI.  Close follow-up in 2 months for reevaluation.

## 2022-06-08 NOTE — Progress Notes (Signed)
Primary Care / Sports Medicine Office Visit  Patient Information:  Patient ID: Jeanette Sandoval, female DOB: [birth year] Age: 37 y.o. MRN: [id number]   Jeanette Sandoval is a pleasant 37 y.o. female presenting with the following:  Chief Complaint  Patient presents with   Hospitalization Follow-up    Is feeling better,     Vitals:   06/08/22 1421  BP: 110/70  Pulse: 98  SpO2: 98%   Vitals:   06/08/22 1421  Weight: 158 lb (71.7 kg)  Height: 5' 5.35" (1.66 m)   Body mass index is 26.01 kg/m.     Independent interpretation of notes and tests performed by another provider:   None  Procedures performed:   None  Pertinent History, Exam, Impression, and Recommendations:   Jeanette Sandoval was seen today for hospitalization follow-up.  Anxiety and depression Assessment & Plan: When reviewing differential for substernal pain we discussed concern over anxiety/depression.  GAD and PHQ scores reviewed and do raise concern for the same.  We discussed both pharmacologic and nonpharmacologic treatment strategies, is amenable to initiation of sertraline 25 mg daily, we will coordinate 99-month follow-up.  I have also encouraged nonpharmacologic management with patient education provided via MyChart.  Orders: -     Sertraline HCl; Take 1 tablet (25 mg total) by mouth daily.  Dispense: 30 tablet; Refill: 1  Substernal pain Overview: ED HPI  Jeanette Sandoval is a 37 y.o. female with history of asthma, bronchiectasis who presents to the emergency department with sudden onset chest pain and shortness of breath that woke her from sleep.  Chest pain is mostly in the left side of her chest and radiates under her arm.  She does have a pleuritic component to her chest pain.  No fevers or cough.  No lower extremity swelling or pain.  No history of PE or DVT.  She took ibuprofen at home and used her inhaler without relief.  Was given Tylenol in triage.  Also complaining of some sore throat as well.   ED  COURSE: Patient's COVID, flu, RSV and strep swabs were negative.  D-dimer elevated.  Patient agrees to proceed with CTA of the chest.  Pregnancy test negative.  Will give IV fluids.    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  No other acute opacities seen.  No cavitation.  No known risk factors for tuberculosis but will question further.  Will discuss with infectious disease for further recommendations.  Signed out to oncoming ED physician.  Assessment & Plan: Noting proceeding ER visit on 05/30/2022, see overview for ER HPI/plan.  Patient has since established with her pulmonologist will refer to cardiology, advised Protonix dosing.  Additionally concern over anxiety/depression raised, see additional assessment(s) for plan details.  Cardiac exam with positive S1 and S2, regular rate and rhythm, no additional heart sounds, there is tenderness about the parasternal regions which do not necessarily recreate her stated symptomatology, hypoactive bowel sounds without hepatosplenomegaly, mild tenderness at the epigastric region, lung fields demonstrate air entry throughout with prolonged expiratory phase, no wheezes, rales, rhonchi.  Discussed differential for substernal chest pain symptoms inclusive of cardiac, gastrointestinal, pulmonary, musculoskeletal, and psychiatric.  She does have upcoming cardiology referral, I have advised patient to continue pantoprazole until she sees cardiology, dietary modifications for GERD related as well, and we will initiate SSRI.  Close follow-up in 2  months for reevaluation.      Orders & Medications Meds ordered this encounter  Medications   sertraline (ZOLOFT) 25 MG tablet    Sig: Take 1 tablet (25 mg total) by mouth daily.    Dispense:  30 tablet    Refill:  1   No orders of the defined types were  placed in this encounter.    Return in about 2 months (around 08/07/2022) for f/u medications.     Montel Culver, MD, [facility]   Primary Care Sports Medicine Primary Care and Sports Medicine at [facility]

## 2022-06-10 ENCOUNTER — Encounter: Payer: Self-pay | Admitting: Internal Medicine

## 2022-06-10 ENCOUNTER — Ambulatory Visit (INDEPENDENT_AMBULATORY_CARE_PROVIDER_SITE_OTHER): Payer: 59

## 2022-06-10 ENCOUNTER — Ambulatory Visit: Payer: 59 | Attending: Internal Medicine | Admitting: Internal Medicine

## 2022-06-10 VITALS — BP 110/70 | HR 74 | Ht 65.0 in | Wt 159.0 lb

## 2022-06-10 DIAGNOSIS — R079 Chest pain, unspecified: Secondary | ICD-10-CM

## 2022-06-10 DIAGNOSIS — R002 Palpitations: Secondary | ICD-10-CM | POA: Diagnosis not present

## 2022-06-10 DIAGNOSIS — R0602 Shortness of breath: Secondary | ICD-10-CM

## 2022-06-10 NOTE — Patient Instructions (Signed)
Medication Instructions:  Your Physician recommend you continue on your current medication as directed.    *If you need a refill on your cardiac medications before your next appointment, please call your pharmacy*   Lab Work: None ordered today   Testing/Procedures: Your physician has recommended that you wear a 14 day Zio monitor.   This monitor is a medical device that records the heart's electrical activity. Doctors most often use these monitors to diagnose arrhythmias. Arrhythmias are problems with the speed or rhythm of the heartbeat. The monitor is a small device applied to your chest. You can wear one while you do your normal daily activities. While wearing this monitor if you have any symptoms to push the button and record what you felt. Once you have worn this monitor for the period of time provider prescribed (Usually 14 days), you will return the monitor device in the postage paid box. Once it is returned they will download the data collected and provide [place] with a report which the provider will then review and we will call you with those results. Important tips:  Avoid showering during the first 24 hours of wearing the monitor. Avoid excessive sweating to help maximize wear time. Do not submerge the device, no hot tubs, and no swimming pools. Keep any lotions or oils away from the patch. After 24 hours you may shower with the patch on. Take brief showers with your back facing the shower head.  Do not remove patch once it has been placed because that will interrupt data and decrease adhesive wear time. Push the button when you have any symptoms and write down what you were feeling. Once you have completed wearing your monitor, remove and place into box which has postage paid and place in your outgoing mailbox.  If for some reason you have misplaced your box then call our office and we can provide another box and/or mail it off for you.    Follow-Up: At [facility], you  and your health needs are our priority.  As part of our continuing mission to provide you with exceptional heart care, we have created designated Provider Care Teams.  These Care Teams include your primary Cardiologist (physician) and Advanced Practice Providers (APPs -  Physician Assistants and Nurse Practitioners) who all work together to provide you with the care you need, when you need it.  We recommend signing up for the patient portal called "MyChart".  Sign up information is provided on this After Visit Summary.  MyChart is used to connect with patients for Virtual Visits (Telemedicine).  Patients are able to view lab/test results, encounter notes, upcoming appointments, etc.  Non-urgent messages can be sent to your provider as well.   To learn more about what you can do with MyChart, go to [email or web address].    Your next appointment:   6 week(s)  Provider:   You may see Nelva Bush, MD or one of the following Advanced Practice Providers on your designated Care Team:   Murray Hodgkins, NP Christell Faith, PA-C Cadence Kathlen Mody, PA-C Gerrie Nordmann, NP

## 2022-06-10 NOTE — Progress Notes (Signed)
New Outpatient Visit Date: 06/10/2022  Referring Provider: Salena Saner, MD [address] [place],  [place] [address]  Chief Complaint: Chest pain and palpitations  HPI:  Jeanette Sandoval is a 37 y.o. female who is being seen today for the evaluation of palpitations and shortness of breath at the request of Dr. Jayme Cloud. She has a history of bronchiectasis, asthma, and allergic rhinitis.  She was seen in the ED earlier this month with chest pain and shortness of breath that woke her from sleep.  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  Chronic fibrotic consolidation associated with bronchiectasis in the medial aspect of the lingula and right middle lobe was unchanged.  Enlarged main pulmonary artery was noted.  She saw Dr. Jayme Cloud for follow-up earlier this week.  Jeanette Sandoval noted some palpitations, prompting referral to [place].  Echocardiogram was also ordered.  Today, Jeanette Sandoval reports that she began having chest pressure and a "digging" sensation behind her left breast in November.  This has been going off and on since then.  However, last week she awoke with severe pressure in the center of her chest that spread along her ribs on both sides and was associated with shortness of breath.  This prompted her to go to the ED for aforementioned workup.  She notes that in the past she has not had chest pain with flares of her bronchiectasis.  She also notes fatigue and increased exertional dyspnea for some time but cannot quantify exactly how long this has been going on.  She notes that she sometimes wakes up with her heart beating more forcefully than usual.  This sometimes accompanies pain in her chest.  It usually resolves over the course of 20 to 30  minutes.  She was recently started on a PPI for possible reflux and notes that her chest discomfort has improved but not completely resolved.  She has not had any lightheadedness or edema.  --------------------------------------------------------------------------------------------------  Cardiovascular History & Procedures: Cardiovascular Problems: Shortness of breath Chest pain Palpitations  Risk Factors: None  Cath/PCI: None  CV Surgery: None  EP Procedures and Devices: None  Non-Invasive Evaluation(s): None  Recent CV Pertinent Labs: Lab Results  Component Value Date   CHOL 225 (H) 03/26/2022   HDL 53 03/26/2022   LDLCALC 156 (H) 03/26/2022   TRIG 81 03/26/2022   CHOLHDL 4.2 03/26/2022   K 3.8 05/30/2022   BUN 18 05/30/2022   CREATININE 0.68 05/30/2022    --------------------------------------------------------------------------------------------------  Past Medical History:  Diagnosis Date   Asthma    Bronchiectasis (HCC)     Past Surgical History:  Procedure Laterality Date   APPENDECTOMY  1999   CESAREAN SECTION  2019   NASAL SINUS SURGERY  2014   WISDOM TOOTH EXTRACTION Bilateral 11/2020    Current Meds  Medication Sig   montelukast (SINGULAIR) 10 MG tablet TAKE 1 TABLET BY MOUTH EVERY DAY   pantoprazole (PROTONIX) 40 MG tablet Take 1 tablet (40 mg total) by mouth daily.    Allergies: Patient has no known allergies.  Social History   Tobacco Use   Smoking status: Never   Smokeless tobacco: Never  Vaping Use   Vaping Use: Never used  Substance Use Topics   Alcohol use: Not Currently   Drug use: Never    Family History  Problem Relation  Age of Onset   Lupus Mother    Hypertension Father    Coronary artery disease Maternal Grandmother    Peripheral Artery Disease Maternal Grandmother    Liver cancer Paternal Grandmother    Stomach cancer Maternal Aunt    Ovarian cancer Maternal Aunt    Breast cancer Neg Hx     Review of  Systems: A 12-system review of systems was performed and was negative except as noted in the HPI.  --------------------------------------------------------------------------------------------------  Physical Exam: BP 110/70 (BP Location: Right Arm, Patient Position: Sitting, Cuff Size: Normal)   Pulse 74   Ht 5\' 5"  (1.651 m)   Wt 159 lb (72.1 kg)   LMP 05/15/2022 (Exact Date)   SpO2 98%   BMI 26.46 kg/m   General: NAD. HEENT: No conjunctival pallor or scleral icterus. Neck: Supple without lymphadenopathy, thyromegaly, JVD, or HJR. No carotid bruit. Lungs: Normal work of breathing.  Faint rhonchi at the right lung base.  No wheezes or crackles. Heart: Regular rate and rhythm without murmurs, rubs, or gallops. Non-displaced PMI. Abd: Bowel sounds present. Soft, NT/ND without hepatosplenomegaly Ext: No lower extremity edema. Radial, PT, and DP pulses are 2+ bilaterally Skin: Warm and dry without rash. Neuro: CNIII-XII intact. Strength and fine-touch sensation intact in upper and lower extremities bilaterally. Psych: Stressed by her ongoing symptoms and is tearful during the exam.  EKG: Normal sinus rhythm without abnormalities.  Lab Results  Component Value Date   WBC 10.8 (H) 05/30/2022   HGB 14.2 05/30/2022   HCT 43.5 05/30/2022   MCV 86.8 05/30/2022   PLT 311 05/30/2022    Lab Results  Component Value Date   NA 137 05/30/2022   K 3.8 05/30/2022   CL 103 05/30/2022   CO2 24 05/30/2022   BUN 18 05/30/2022   CREATININE 0.68 05/30/2022   GLUCOSE 105 (H) 05/30/2022   ALT 29 05/30/2022    Lab Results  Component Value Date   CHOL 225 (H) 03/26/2022   HDL 53 03/26/2022   LDLCALC 156 (H) 03/26/2022   TRIG 81 03/26/2022   CHOLHDL 4.2 03/26/2022     --------------------------------------------------------------------------------------------------  ASSESSMENT AND PLAN: Chest pain, shortness of breath, and palpitations: These symptoms have been ongoing for at least a  few months with intermittent flares including an episode of more severe chest pain that led to recent ED visit.  Workup was unremarkable other than areas of consolidation in the right middle lobe and lingula superimposed on chronic bronchiectatic changes as well as mildly enlarged main pulmonary artery.  She notes some improvement with recent initiation of a PPI.  Her examination today is unremarkable other than faint rhonchi in her lungs.  Echocardiogram has already been ordered by Dr. Patsey Berthold; we will try to expedite this.  Particular attention will need to be paid to her right heart and pulmonary artery pressure given CT finding of dilated pulmonary artery.  I have also recommended that Ms.: Sandoval wear a 14-day event monitor to better characterize her palpitations.  I have a low suspicion for atherosclerotic coronary artery disease contributing to her atypical chest pain.  I reviewed her recent chest CTA, which shows no evidence of pericardial effusion or cardiac enlargement.  Though motion artifact limits evaluation, her coronaries also appear to arise from their expected locations.  If symptoms persist and aforementioned workup is unrevealing, ischemia evaluation may need to be readdressed in the future.  I encouraged Jeanette Sandoval to consider starting sertraline prescribed by Dr. Zigmund Daniel, as a component  of anxiety could also be playing a role in her symptoms.  Follow-up: Return to clinic in 6 weeks.  Nelva Bush, MD 06/10/2022 2:25 PM

## 2022-06-11 ENCOUNTER — Encounter: Payer: Self-pay | Admitting: Internal Medicine

## 2022-06-11 DIAGNOSIS — R0602 Shortness of breath: Secondary | ICD-10-CM | POA: Insufficient documentation

## 2022-06-15 ENCOUNTER — Encounter: Payer: Self-pay | Admitting: Pulmonary Disease

## 2022-06-15 ENCOUNTER — Ambulatory Visit: Payer: 59 | Attending: Pulmonary Disease

## 2022-06-15 DIAGNOSIS — R002 Palpitations: Secondary | ICD-10-CM | POA: Diagnosis not present

## 2022-06-15 DIAGNOSIS — R0602 Shortness of breath: Secondary | ICD-10-CM

## 2022-06-15 MED ORDER — PERFLUTREN LIPID MICROSPHERE
1.0000 mL | INTRAVENOUS | Status: AC | PRN
  Administered 2022-06-15: 2 mL via INTRAVENOUS

## 2022-06-15 NOTE — Addendum Note (Signed)
Addended by: Claudette Head A on: 06/15/2022 04:34 PM   Modules accepted: Orders

## 2022-06-15 NOTE — Telephone Encounter (Signed)
Dr. Gonzalez, please advise. Thanks 

## 2022-06-15 NOTE — Telephone Encounter (Signed)
We will cancel the sputum specimen for now.  Continue her therapy as she is now doing.

## 2022-06-16 LAB — ECHOCARDIOGRAM COMPLETE
AR max vel: 2.87 cm2
AV Area VTI: 3.01 cm2
AV Area mean vel: 2.59 cm2
AV Mean grad: 6 mmHg
AV Peak grad: 10.1 mmHg
Ao pk vel: 1.59 m/s
Area-P 1/2: 3.61 cm2
S' Lateral: 2.9 cm

## 2022-06-17 ENCOUNTER — Ambulatory Visit: Payer: 59 | Admitting: Pulmonary Disease

## 2022-07-14 ENCOUNTER — Telehealth: Payer: Self-pay

## 2022-07-14 NOTE — Telephone Encounter (Signed)
Called pt regarding monitor. Pt stated she has completed wearing monitor but forgot to return it.  Pt stated she will place monitor in the mail for review prior to upcoming appointment on 3/8.

## 2022-07-17 ENCOUNTER — Encounter: Payer: Self-pay | Admitting: Pulmonary Disease

## 2022-07-22 ENCOUNTER — Encounter: Payer: Self-pay | Admitting: Pulmonary Disease

## 2022-07-22 ENCOUNTER — Ambulatory Visit (INDEPENDENT_AMBULATORY_CARE_PROVIDER_SITE_OTHER): Payer: 59 | Admitting: Pulmonary Disease

## 2022-07-22 VITALS — BP 118/70 | HR 78 | Temp 97.5°F | Ht 65.0 in | Wt 155.6 lb

## 2022-07-22 DIAGNOSIS — K219 Gastro-esophageal reflux disease without esophagitis: Secondary | ICD-10-CM | POA: Diagnosis not present

## 2022-07-22 DIAGNOSIS — J4540 Moderate persistent asthma, uncomplicated: Secondary | ICD-10-CM | POA: Diagnosis not present

## 2022-07-22 DIAGNOSIS — R002 Palpitations: Secondary | ICD-10-CM

## 2022-07-22 DIAGNOSIS — J479 Bronchiectasis, uncomplicated: Secondary | ICD-10-CM | POA: Diagnosis not present

## 2022-07-22 NOTE — Progress Notes (Signed)
Subjective:    Patient ID: Jeanette Sandoval, female    DOB: 14-Jan-1986, 37 y.o.   MRN: [id number] Patient Care Team: Montel Culver, MD as PCP - General (Family Medicine) End, Harrell Gave, MD as PCP - Cardiology (Cardiology)  Chief Complaint  Patient presents with   Follow-up    No SOB or wheezing. Cough with yellowish sputum.     HPI Patient is a 37 year old lifelong never smoker, native of [place], who follows here for the issue of bronchiectasis.  This is a scheduled visit.  Last visit with me was 07 June 2022.  Recall that she presented to the emergency room on 7 January with complaint of chest pain. CT angio chest that showed possible mild enlargement of the pulmonary artery.  No PE.  She had the previously noted bronchiectatic changes.  There was some tree-in-bud micro nodularity and groundglass attenuation airspace opacities which were consistent with an acute infectious process.  I suspect that this was due to a bronchiectasis flare.  She did not receive any antibiotics.  At her follow-up appointment from that ED visit she continued to complain of chest pain, globus sensation and tachypalpitations.  At that visit we ordered a sputum culture however she was unable to produce the sputum.  Referred to cardiology and an echocardiogram was obtained.  She was also treated with doxycycline for her flare and after careful interview it appear that her chest pain was driven by gastroesophageal reflux and esophageal spasm she was given a trial of Protonix and she notes that this has been immensely helpful.  She has not had any chest pain since starting on the Protonix.  She was evaluated by cardiology and a Zio patch was placed she is still awaiting results of that.  Echocardiogram was obtained on 15 June 2022 and it was normal.  LVEF was 60 to 65%.  Recall that she has had extensive work-up for her bronchiectasis including bronchoscopy in the past (in [place]).  She has had negative  sputum collected here for regular culture and MAI.  Prior negative QuantiFERON gold.  She does note that since she started vest physiotherapy her cough has pretty much resolved and her sputum production is more manageable. She has not had any recent fevers, chills or sweats since her last visit here.  Her shortness of breath lingers is resolved.  No other new symptomatology.   Overall she feels well and looks well.     Review of Systems A 10 point review of systems was performed and it is as noted above otherwise negative.  Patient Active Problem List   Diagnosis Date Noted   Shortness of breath 06/11/2022   Anxiety and depression 06/08/2022   Chest pain of uncertain etiology [id number]   Annual physical exam 03/08/2022   Cervical cancer screening 03/08/2022   Irregular menses 03/08/2022   Malar rash 03/02/2021   Bronchiectasis without complication ([place]) [id number]   Allergic rhinitis 03/02/2021   Breast pain, left 03/02/2021   Social History   Tobacco Use   Smoking status: Never   Smokeless tobacco: Never  Substance Use Topics   Alcohol use: Not Currently   No Known Allergies Current Meds  Medication Sig   albuterol (VENTOLIN HFA) 108 (90 Base) MCG/ACT inhaler Inhale 2 puffs into the lungs every 6 (six) hours as needed for wheezing or shortness of breath.   budesonide-formoterol (SYMBICORT) 160-4.5 MCG/ACT inhaler Inhale 2 puffs into the lungs 2 (two) times daily.   montelukast (SINGULAIR) 10 MG tablet TAKE  1 TABLET BY MOUTH EVERY DAY   pantoprazole (PROTONIX) 40 MG tablet Take 1 tablet (40 mg total) by mouth daily.   sertraline (ZOLOFT) 25 MG tablet Take 1 tablet (25 mg total) by mouth daily.   Immunization History  Administered Date(s) Administered   Influenza-Unspecified 02/25/2021, 03/03/2022   Pfizer Covid-19 Vaccine Bivalent Booster 66yr & up 06/10/2020       Objective:   Physical Exam BP 118/70 (BP Location: Left Arm, Cuff Size: Normal)   Pulse 78   Temp (!)  97.5 F (36.4 C)   Ht '5\' 5"'$  (1.651 m)   Wt 155 lb 9.6 oz (70.6 kg)   SpO2 97%   BMI 25.89 kg/m   SpO2: 97 % O2 Device: None (Room air)  GENERAL: Well-developed, well-nourished woman, no acute distress.  Fully ambulatory.  No conversational dyspnea HEAD: Normocephalic, atraumatic.  EYES: Pupils equal, round, reactive to light.  No scleral icterus.  MOUTH: Dentition intact, no mucosal abnormalities. NECK: Supple. No thyromegaly. Trachea midline. No JVD.  No adenopathy. PULMONARY: Good air entry bilaterally.  No adventitious sounds.   CARDIOVASCULAR: S1 and S2. Regular rate and rhythm.  No rubs, murmurs or gallops heard. ABDOMEN: Benign. MUSCULOSKELETAL: No joint deformity, no clubbing, no edema.  NEUROLOGIC: No overt focal deficit, no gait disturbance, speech is fluent. SKIN: Intact,warm,dry. PSYCH: Mood and behavior normal.      Assessment & Plan:     ICD-10-CM   1. Bron[place] complication [place]  [id number]   Continue vest physiotherapy    2. Moderate persistent asthma without complication  [id number]   Continue Symbicort 160/4.5, 2 and twice a day Continue as needed albuterol    3. Gastroesophageal reflux disease, unspecified whether esophagitis present  K21.9    Continue Protonix for now Patient is following antireflux measures    4. Palpitations  R00.2    Zio patch results pending Follows with cardiology     The patient in follow-up in 6 to 8 weeks time she is to contact [place] to that time should any new difficulties arise.  CRenold Don MD Advanced Bronchoscopy PCCM [facility]    *This note was dictated using voice recognition software/Dragon.  Despite best efforts to proofread, errors can occur which can change the meaning. Any transcriptional errors that result from this process are unintentional and may not be fully corrected at the time of dictation.

## 2022-07-22 NOTE — Patient Instructions (Addendum)
Continue taking your Protonix.  Continue with your antireflux measures.  Continue taking her Symbicort 2 puffs twice a day.  We will see you in follow-up in 6 to 8 weeks time call sooner should any new problems arise.   Contine tomando su Protonix. Contine con sus medidas antirreflujo.  Contine tomando Symbicort 2 inhalaciones dos veces al da.  Nos veremos en el seguimiento dentro de 6 a 8 semanas, llame antes si surge algn problema nuevo.

## 2022-07-26 ENCOUNTER — Encounter: Payer: Self-pay | Admitting: Pulmonary Disease

## 2022-07-30 ENCOUNTER — Ambulatory Visit: Payer: 59 | Admitting: Internal Medicine

## 2022-08-09 ENCOUNTER — Other Ambulatory Visit: Payer: Self-pay | Admitting: Family Medicine

## 2022-08-09 ENCOUNTER — Ambulatory Visit: Payer: 59 | Admitting: Family Medicine

## 2022-08-09 ENCOUNTER — Encounter: Payer: Self-pay | Admitting: Family Medicine

## 2022-08-09 VITALS — BP 118/80 | HR 74 | Ht 65.0 in | Wt 155.0 lb

## 2022-08-09 DIAGNOSIS — K5909 Other constipation: Secondary | ICD-10-CM

## 2022-08-09 DIAGNOSIS — K2100 Gastro-esophageal reflux disease with esophagitis, without bleeding: Secondary | ICD-10-CM

## 2022-08-09 DIAGNOSIS — F32A Depression, unspecified: Secondary | ICD-10-CM | POA: Diagnosis not present

## 2022-08-09 DIAGNOSIS — F419 Anxiety disorder, unspecified: Secondary | ICD-10-CM

## 2022-08-09 MED ORDER — SERTRALINE HCL 50 MG PO TABS: 50.0000 mg | ORAL_TABLET | Freq: Every day | ORAL | 0 refills | Status: DC

## 2022-08-09 MED ORDER — PANTOPRAZOLE SODIUM 40 MG PO TBEC: 40.0000 mg | DELAYED_RELEASE_TABLET | Freq: Every day | ORAL | 0 refills | Status: DC

## 2022-08-09 NOTE — Patient Instructions (Addendum)
-   Continue pantoprazole daily (take on empty stomach) - Take new dose of sertraline - Review information attached - Start senna-docusate 1-2 times daily - If no bowel movement after 3 days, take bisacodyl (Dulcolax) - Coordinator will contact about GI scheduling - Return in 3 months

## 2022-08-09 NOTE — Progress Notes (Signed)
     Primary Care / Sports Medicine Office Visit  Patient Information:  Patient ID: Jeanette Sandoval, female DOB: 03-13-1986 Age: 37 y.o. MRN: [id number]   Jeanette Sandoval is a pleasant 37 y.o. female presenting with the following:  Chief Complaint  Patient presents with   Anxiety and depression    Vitals:   08/09/22 1533  BP: 118/80  Pulse: 74  SpO2: 96%   Vitals:   08/09/22 1533  Weight: 155 lb (70.3 kg)  Height: 5\' 5"  (1.651 m)   Body mass index is 25.79 kg/m.  LONG TERM MONITOR (3-14 DAYS)  Result Date: 08/02/2022   The patient was monitored for 14 days.   The predominant rhythm was sinus with an average rate of 79 bpm (range 49-154 bpm).   There were rare PAC's and PVC's.   A single supraventricular run was noted, lasting 14 beats with a maximum rate of 146 bpm.   No sustained arrhythmia or prolonged pause was observed.   Patient triggered events mostly correspond to sinus rhythm.  An isolated PAC was noted during one of the patient triggered events. Predominantly sinus rhythm with rare PAC's and PVC's as well as a single brief episodes of PSVT.  No significant arrhythmia identified to explain the patient's symptoms.     Independent interpretation of notes and tests performed by another provider:   None  Procedures performed:   None  Pertinent History, Exam, Impression, and Recommendations:   Artia was seen today for anxiety and depression.  Anxiety and depression Assessment & Plan: Tolerating sertraline without issue, still symptomatic and PHQ/GAD scores reviewed.  Plan as follows: - Titrate sertraline to 50 mg daily - Return in 3 months  Orders: -     Sertraline HCl; Take 1 tablet (50 mg total) by mouth daily.  Dispense: 90 tablet; Refill: 0  Gastroesophageal reflux disease with esophagitis without hemorrhage Assessment & Plan: Patient has noted improvement following initiation of pantoprazole highlighting a definite GI component to her substernal  symptoms.  Plan as follows: - Lifestyle modifications encouraged, patient information attached - Continue pantoprazole - Referral to GI placed  Orders: -     Pantoprazole Sodium; Take 1 tablet (40 mg total) by mouth daily.  Dispense: 90 tablet; Refill: 0 -     Ambulatory referral to Gastroenterology  Other constipation Assessment & Plan: Longstanding issue in the setting of comorbid GERD.  Plan as follows: - Patient oriented information provided on lifestyle modifications - Start senna-docusate 1-2 times daily - For no BM x 3 days, bisacodyl advised - Referral to GI has been placed  Orders: -     Ambulatory referral to Gastroenterology     Orders & Medications Meds ordered this encounter  Medications   sertraline (ZOLOFT) 50 MG tablet    Sig: Take 1 tablet (50 mg total) by mouth daily.    Dispense:  90 tablet    Refill:  0   pantoprazole (PROTONIX) 40 MG tablet    Sig: Take 1 tablet (40 mg total) by mouth daily.    Dispense:  90 tablet    Refill:  0   Orders Placed This Encounter  Procedures   Ambulatory referral to Gastroenterology     Return in about 3 months (around 11/09/2022) for follow-up medications and GI.     Montel Culver, MD, [facility]   Primary Care Sports Medicine Primary Care and Sports Medicine at [facility]

## 2022-08-10 NOTE — Telephone Encounter (Signed)
Unable to refill per protocol, Rx request is too soon.  Last refill 08/09/22 for 90 days. Duplicate request.  Requested Prescriptions  Pending Prescriptions Disp Refills   sertraline (ZOLOFT) 25 MG tablet [Pharmacy Med Name: SERTRALINE HCL 25 MG TABLET] 30 tablet 1    Sig: TAKE 1 TABLET (25 MG TOTAL) BY MOUTH DAILY.     Psychiatry:  Antidepressants - SSRI - sertraline Failed - 08/09/2022  2:16 AM      Failed - AST in normal range and within 360 days    AST  Date Value Ref Range Status  05/30/2022 42 (H) 15 - 41 U/L Final         Passed - ALT in normal range and within 360 days    ALT  Date Value Ref Range Status  05/30/2022 29 0 - 44 U/L Final         Passed - Completed PHQ-2 or PHQ-9 in the last 360 days      Passed - Valid encounter within last 6 months    Recent Outpatient Visits           Yesterday Gastroesophageal reflux disease with esophagitis without hemorrhage   [place] [place], Earley Abide, MD   2 months ago Anxiety and depression   [facility] [place] [facility] [place], Earley Abide, MD   5 months ago Annual physical exam   [place] [facility], Earley Abide, MD   1 year ago Malar rash   [place] [facility], Earley Abide, MD       Future Appointments             In 2 months Zigmund Daniel, Earley Abide, MD [place] [facility], [facility]

## 2022-08-23 DIAGNOSIS — K2100 Gastro-esophageal reflux disease with esophagitis, without bleeding: Secondary | ICD-10-CM | POA: Insufficient documentation

## 2022-08-23 DIAGNOSIS — K5909 Other constipation: Secondary | ICD-10-CM | POA: Insufficient documentation

## 2022-08-23 NOTE — Assessment & Plan Note (Signed)
Tolerating sertraline without issue, still symptomatic and PHQ/GAD scores reviewed.  Plan as follows: - Titrate sertraline to 50 mg daily - Return in 3 months

## 2022-08-23 NOTE — Assessment & Plan Note (Signed)
Longstanding issue in the setting of comorbid GERD.  Plan as follows: - Patient oriented information provided on lifestyle modifications - Start senna-docusate 1-2 times daily - For no BM x 3 days, bisacodyl advised - Referral to GI has been placed

## 2022-08-23 NOTE — Assessment & Plan Note (Signed)
Patient has noted improvement following initiation of pantoprazole highlighting a definite GI component to her substernal symptoms.  Plan as follows: - Lifestyle modifications encouraged, patient information attached - Continue pantoprazole - Referral to GI placed

## 2022-09-16 ENCOUNTER — Encounter: Payer: Self-pay | Admitting: Pulmonary Disease

## 2022-09-16 ENCOUNTER — Ambulatory Visit: Payer: 59 | Admitting: Pulmonary Disease

## 2022-09-16 VITALS — BP 110/70 | HR 89 | Temp 98.4°F | Ht 65.0 in | Wt 150.8 lb

## 2022-09-16 DIAGNOSIS — J479 Bronchiectasis, uncomplicated: Secondary | ICD-10-CM

## 2022-09-16 DIAGNOSIS — K219 Gastro-esophageal reflux disease without esophagitis: Secondary | ICD-10-CM | POA: Diagnosis not present

## 2022-09-16 DIAGNOSIS — R1011 Right upper quadrant pain: Secondary | ICD-10-CM | POA: Diagnosis not present

## 2022-09-16 DIAGNOSIS — J4540 Moderate persistent asthma, uncomplicated: Secondary | ICD-10-CM

## 2022-09-16 NOTE — Progress Notes (Signed)
Subjective:    Patient ID: Jeanette Sandoval, female    DOB: 1985-06-18, 37 y.o.   MRN: [id number] Patient Care Team: Jerrol Banana, MD as PCP - General (Family Medicine) End, Cristal Deer, MD as PCP - Cardiology (Cardiology) Salena Saner, MD as Consulting Physician (Pulmonary Disease)  Chief Complaint  Patient presents with   Follow-up    No SOB or wheezing. Cough with yellow sputum.    HPI Patient is a 37 year old lifelong never smoker, native of [place], who follows here for the issue of bronchiectasis.  This is a scheduled visit.  Last visit with me was 02 July 2022.  Recall that she presented to the emergency room on 7 January with complaint of chest pain. CT angio chest that showed possible mild enlargement of the pulmonary artery.  No PE.  She had the previously noted bronchiectatic changes.  There was some tree-in-bud micro nodularity and groundglass attenuation airspace opacities which were consistent with an acute infectious process.  I suspect that this was due to a bronchiectasis flare.  She did not receive any antibiotics.  At her follow-up appointment from that ED visit she continued to complain of chest pain, globus sensation and tachypalpitations.  At that visit we ordered a sputum culture however she was unable to produce the sputum.  Referred to cardiology and an echocardiogram was obtained.  She was also treated with doxycycline for her flare and after careful interview it appear that her chest pain was driven by gastroesophageal reflux and esophageal spasm she was given a trial of Protonix and she notes that this has been immensely helpful.  She initially did very well with the Protonix but now has been having recurrent colicky type of pain particularly in the right upper quadrant and across her waist and chest discomfort that is relieved by belching.  She has noted that fatty foods trigger the symptoms.  She has an upcoming evaluation by GI to evaluate these  issues.  She is on Symbicort 160/4.5, 2 inhalations twice a day for moderate persistent asthma.  She uses albuterol rarely perhaps once to twice per month.  She is compliant with the medication.   Echocardiogram was obtained on 15 June 2022 and it was normal.  LVEF was 60 to 65%.  She had a Zio patch placed to evaluate for palpitations and is being followed by cardiology.  So far she has had a benign cardiac evaluation..   Recall that she has had extensive work-up for her bronchiectasis including bronchoscopy in the past (in [place]).  She has had negative sputum collected here for regular culture and MAI.  Prior negative QuantiFERON gold.  She does note that since she started vest physiotherapy her cough has pretty much resolved and her sputum production is more manageable however, she has had variable compliance with vest physiotherapy because she gets "busy". She has not had any recent fevers, chills or sweats since her last visit here.  She has not had shortness of breath.  No other new symptomatology.   Review of Systems A 10 point review of systems was performed and it is as noted above otherwise negative.  Patient Active Problem List   Diagnosis Date Noted   Gastroesophageal reflux disease with esophagitis without hemorrhage 08/23/2022   Other constipation 08/23/2022   Shortness of breath 06/11/2022   Anxiety and depression 06/08/2022   Chest pain of uncertain etiology 06/08/2022   Annual physical exam 03/08/2022   Cervical cancer screening 03/08/2022   Irregular menses 03/08/2022  Malar rash 03/02/2021   Bronchiectasis without complication 03/02/2021   Allergic rhinitis 03/02/2021   Breast pain, left 03/02/2021   Social History   Tobacco Use   Smoking status: Never   Smokeless tobacco: Never  Substance Use Topics   Alcohol use: Not Currently   No Known Allergies Current Meds  Medication Sig   albuterol (VENTOLIN HFA) 108 (90 Base) MCG/ACT inhaler Inhale 2 puffs into  the lungs every 6 (six) hours as needed for wheezing or shortness of breath.   budesonide-formoterol (SYMBICORT) 160-4.5 MCG/ACT inhaler Inhale 2 puffs into the lungs 2 (two) times daily.   montelukast (SINGULAIR) 10 MG tablet TAKE 1 TABLET BY MOUTH EVERY DAY   pantoprazole (PROTONIX) 40 MG tablet Take 1 tablet (40 mg total) by mouth daily.   sertraline (ZOLOFT) 50 MG tablet Take 1 tablet (50 mg total) by mouth daily.   Immunization History  Administered Date(s) Administered   Influenza-Unspecified 02/25/2021, 03/03/2022   Pfizer Covid-19 Vaccine Bivalent Booster 1yrs & up 06/10/2020       Objective:   Physical Exam BP 110/70 (BP Location: Left Arm, Cuff Size: Normal)   Pulse 89   Temp 98.4 F (36.9 C)   Ht  (1.651 m)   Wt 150 lb 12.8 oz (68.4 kg)   SpO2 97%   BMI 25.09 kg/m   SpO2: 97 % O2 Device: None (Room air)  GENERAL: Well-developed, well-nourished woman, no acute distress.  Fully ambulatory.  No conversational dyspnea HEAD: Normocephalic, atraumatic.  EYES: Pupils equal, round, reactive to light.  No scleral icterus.  MOUTH: Dentition intact, no mucosal abnormalities. NECK: Supple. No thyromegaly. Trachea midline. No JVD.  No adenopathy. PULMONARY: Good air entry bilaterally.  No adventitious sounds.   CARDIOVASCULAR: S1 and S2. Regular rate and rhythm.  No rubs, murmurs or gallops heard. ABDOMEN: Benign. MUSCULOSKELETAL: No joint deformity, no clubbing, no edema.  NEUROLOGIC: No overt focal deficit, no gait disturbance, speech is fluent. SKIN: Intact,warm,dry. PSYCH: Mood and behavior normal.     Assessment & Plan:     ICD-10-CM   1. Bronchiectasis without complication  J47.9    Encourage use of vest physiotherapy twice a day Continue pulmonary hygiene    2. Moderate persistent asthma without complication  J45.40    Continue Symbicort 160/4.5, 2 inhalations twice a day Continue as needed albuterol Appears well compensated    3. Gastroesophageal  reflux disease, unspecified whether esophagitis present  K21.9    Continue Protonix Continue antireflux measures    4. Colicky RUQ abdominal pain  R10.11 US Abdomen Limited RUQ (LIVER/GB)   Query cholelithiasis Right upper quadrant ultrasound     Orders Placed This Encounter  Procedures   US Abdomen Limited RUQ (LIVER/GB)    Patient with right upper quadrant pain and what appears to be post prandial.  Rule out cholelithiasis.    Standing Status:   Future    Standing Expiration Date:   12/16/2022    Order Specific Question:   Reason for Exam (SYMPTOM  OR DIAGNOSIS REQUIRED)    Answer:   RUQ pain    Order Specific Question:   Preferred imaging location?    Answer:   [facility]   Will see the patient in follow-up in 3 months time she is to contact us prior to that time should any new difficulties arise.  We will call her the results of the right upper quadrant ultrasound once these are known.  Gailen Shelter, MD Advanced [facility]ch[facility]    *This note was dictated using voice recognition software/Dragon.  Despite best efforts to proofread, errors can occur which can change the meaning. Any transcriptional errors that result from this process are unintentional and may not be fully corrected at the time of dictation.

## 2022-09-16 NOTE — Patient Instructions (Addendum)
We are going to schedule an ultrasound of the right upper quadrant which will tell me about your gallbladder.   Continue using your vest and the inhalers.  We will see you in follow-up in 3 months time.  Call sooner should you have any difficulties prior to that.    Vamos a programar una ecografa del cuadrante superior derecho que me informar sobre su vescula biliar.  Contine usando su chaleco y los inhaladores.  Nos veremos en seguimiento dentro de 3 meses. Llame antes si tiene alguna dificultad antes de eso.

## 2022-09-20 ENCOUNTER — Ambulatory Visit
Admission: RE | Admit: 2022-09-20 | Discharge: 2022-09-20 | Disposition: A | Discharge status: Home / Self Care | Payer: 59 | Source: Ambulatory Visit | Attending: Pulmonary Disease | Admitting: Pulmonary Disease

## 2022-09-20 ENCOUNTER — Encounter: Payer: Self-pay | Admitting: Family Medicine

## 2022-09-20 DIAGNOSIS — R1011 Right upper quadrant pain: Secondary | ICD-10-CM | POA: Insufficient documentation

## 2022-09-21 ENCOUNTER — Other Ambulatory Visit: Payer: Self-pay

## 2022-09-21 ENCOUNTER — Other Ambulatory Visit: Payer: Self-pay | Admitting: Family Medicine

## 2022-09-21 DIAGNOSIS — K8020 Calculus of gallbladder without cholecystitis without obstruction: Secondary | ICD-10-CM

## 2022-09-21 NOTE — Telephone Encounter (Signed)
Please advise 

## 2022-09-21 NOTE — Telephone Encounter (Signed)
Referral placed.

## 2022-09-21 NOTE — Telephone Encounter (Signed)
Looks like pt has gallstones.

## 2022-09-29 ENCOUNTER — Encounter: Payer: Self-pay | Admitting: Surgery

## 2022-09-29 ENCOUNTER — Ambulatory Visit (INDEPENDENT_AMBULATORY_CARE_PROVIDER_SITE_OTHER): Payer: 59 | Admitting: Surgery

## 2022-09-29 VITALS — BP 116/77 | HR 79 | Temp 97.8°F | Ht 65.0 in | Wt 143.8 lb

## 2022-09-29 DIAGNOSIS — K8020 Calculus of gallbladder without cholecystitis without obstruction: Secondary | ICD-10-CM

## 2022-09-29 NOTE — H&P (View-Only) (Signed)
09/29/2022  Reason for Visit:  Symptomatic cholelithiasis  Requesting Provider:  Jason Matthews, MD  History of Present Illness: Jeanette Sandoval is a 37 y.o. female presenting for evaluation of abdominal pain.  The patient presented to the ED on 05/30/22 with chest pain.  CTA showed  PE, but possible atypical mycobacterial infection and bronchiectasis.  She has been evaluated by cardiology and pulmonology.  She was started on Protonix and also had an ultrasound of the RUQ on 09/20/22 which showed cholelithiasis, likely a gallbladder full of stones.  She reports she's had multiple episodes of pain since January.  The pain starts in the back and radiates towards the bilateral upper quadrants.  It's not necessarily related to po intake and the episodes have been at random times.  She does get nausea and also feels bloated.  She has a referral with GI but her appointment is not until August.    Past Medical History: Past Medical History:  Diagnosis Date   Asthma    Bronchiectasis (HCC)      Past Surgical History: Past Surgical History:  Procedure Laterality Date   APPENDECTOMY  1999   CESAREAN SECTION  2019   NASAL SINUS SURGERY  2014   WISDOM TOOTH EXTRACTION Bilateral 11/2020    Home Medications: Prior to Admission medications   Medication Sig Start Date End Date Taking? Authorizing Provider  albuterol (VENTOLIN HFA) 108 (90 Base) MCG/ACT inhaler Inhale 2 puffs into the lungs every 6 (six) hours as needed for wheezing or shortness of breath. 10/16/21  Yes Gonzalez, Carmen L, MD  budesonide-formoterol (SYMBICORT) 160-4.5 MCG/ACT inhaler Inhale 2 puffs into the lungs 2 (two) times daily. 10/16/21  Yes Gonzalez, Carmen L, MD  montelukast (SINGULAIR) 10 MG tablet TAKE 1 TABLET BY MOUTH EVERY DAY 02/10/22  Yes Gonzalez, Carmen L, MD  pantoprazole (PROTONIX) 40 MG tablet Take 1 tablet (40 mg total) by mouth daily. 08/09/22 12/07/22 Yes Matthews, Jason J, MD  sertraline (ZOLOFT) 50 MG tablet Take 1  tablet (50 mg total) by mouth daily. 08/09/22  Yes Matthews, Jason J, MD    Allergies: No Known Allergies  Social History:  reports that she has never smoked. She has never used smokeless tobacco. She reports that she does not currently use alcohol. She reports that she does not use drugs.   Family History: Family History  Problem Relation Age of Onset   Lupus Mother    Hypertension Father    Coronary artery disease Maternal Grandmother    Peripheral Artery Disease Maternal Grandmother    Liver cancer Paternal Grandmother    Stomach cancer Maternal Aunt    Ovarian cancer Maternal Aunt    Breast cancer Neg Hx     Review of Systems: Review of Systems  Constitutional:  Negative for chills and fever.  HENT:  Negative for hearing loss.   Respiratory:  Negative for shortness of breath.   Cardiovascular:  Negative for chest pain.  Gastrointestinal:  Positive for abdominal pain and nausea. Negative for vomiting.  Genitourinary:  Negative for dysuria.  Musculoskeletal:  Negative for myalgias.  Skin:  Negative for rash.  Neurological:  Negative for dizziness.  Psychiatric/Behavioral:  Negative for depression.     Physical Exam BP 116/77   Pulse 79   Temp 97.8 F (36.6 C) (Oral)   Ht 5' 5" (1.651 m)   Wt 143 lb 12.8 oz (65.2 kg)   SpO2 97%   BMI 23.93 kg/m  CONSTITUTIONAL: No acute distress, well nourished. HEENT:    Normocephalic, atraumatic, extraocular motion intact. NECK: Trachea is midline, and there is no jugular venous distension.  RESPIRATORY:  Lungs are clear, and breath sounds are equal bilaterally. Normal respiratory effort without pathologic use of accessory muscles. CARDIOVASCULAR: Heart is regular without murmurs, gallops, or rubs. GI: The abdomen is soft, non-distended, currently non-tender to palpation.  Negative Murphy's sign.  MUSCULOSKELETAL:  Normal muscle strength and tone in all four extremities.  No peripheral edema or cyanosis. SKIN: Skin turgor is  normal. There are no pathologic skin lesions.  NEUROLOGIC:  Motor and sensation is grossly normal.  Cranial nerves are grossly intact. PSYCH:  Alert and oriented to person, place and time. Affect is normal.  Laboratory Analysis: Labs from 05/30/2022: Sodium 137, potassium 3.8, chloride 103, CO2 24, BUN 18, creatinine 0.68.  Total bilirubin 0.6, AST 42, ALT 29, alkaline phosphatase 107, albumin 4.3.  WBC 10.8, hemoglobin 14.2, hematocrit 43.5, platelets 311.  Imaging: CTA chest on 05/30/2022: IMPRESSION: 1. Negative for acute pulmonary embolus. 2. Interval progression of multifocal tree-in-bud micro nodularity and peribronchovascular ground-glass attenuation airspace opacities compared to prior imaging from December of 2022. 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 3. Similar appearance of chronic fibrotic consolidation with associated bronchiectasis in the medial aspects of the lingula and right middle lobe. 4. Enlarged main pulmonary artery at 3 cm suggests pulmonary arterial hypertension. 5. Slightly increased prominence of mediastinal and right hilar nodal tissue which is almost certainly reactive to the chronic infectious/inflammatory process involving the lungs.  Ultrasound RUQ on 09/20/2022: IMPRESSION: Echogenicity at the level of the gallbladder with diffuse posterior acoustic shadowing, raising the possibility of extensive cholelithiasis within the gallbladder. No secondary signs of acute cholecystitis.  Assessment and Plan: This is a 37 y.o. female with symptomatic cholelithiasis.  -Discussed with the patient how the gallbladder works and how the stones can be causing episodes of pain.  Some of her symptoms are somewhat atypical in the location and how they're not necessarily related to po intake.  However, her ultrasound does show extensive cholelithiasis.  Discussed with her that we can offer surgery, but could not  guarantee that all her symptoms would resolve with cholecystectomy.  We could also wait until after she's been evaluated by GI, but that would mean waiting until after her appointment.  After further discussion, she would like to proceed with surgical management. --Discussed with her then the plan for a robotic assisted cholecystectomy.  Reviewed the surgery at length including the planned incisions, the risks of bleeding, infection, injury to surrounding structures, the use of ICG to better evaluate the biliary anatomy, that this would be an outpatient surgery, post-operative activity restrictions, pain control, and she's willing to proceed. --Will schedule her for surgery on 10/06/22.  All of her questions have been answered.  I spent 55 minutes dedicated to the care of this patient on the date of this encounter to include pre-visit review of records, face-to-face time with the patient discussing diagnosis and management, and any post-visit coordination of care.   Suheily Birks Luis Jestine Bicknell, MD [facility]    

## 2022-09-29 NOTE — Patient Instructions (Signed)
Our surgery scheduler Barbara will call you within 24-48 hours to get you scheduled. If you have not heard from her after 48 hours, please call our office. Have the blue sheet available when she calls to write down important information.   If you have any concerns or questions, please feel free to call our office.    Minimally Invasive Cholecystectomy  Minimally invasive cholecystectomy is surgery to remove the gallbladder. The gallbladder is a pear-shaped organ that lies beneath the liver on the right side of the body. The gallbladder stores bile, which is a fluid that helps the body digest fats. Cholecystectomy is often done to treat inflammation (irritation and swelling) of the gallbladder (cholecystitis). This condition is usually caused by a buildup of gallstones (cholelithiasis) in the gallbladder or when the fluid in the gall bladder becomes stagnant because gallstones get stuck in the ducts (tubes) and block the flow of bile. This can result in inflammation and pain. In severe cases, emergency surgery may be required. This procedure is done through small incisions in the abdomen, instead of one large incision. It is also called laparoscopic surgery. A thin scope with a camera (laparoscope) is inserted through one incision. Then surgical instruments are inserted through the other incisions. In some cases, a minimally invasive surgery may need to be changed to a surgery that is done through a larger incision. This is called open surgery. Tell a health care provider about: Any allergies you have. All medicines you are taking, including vitamins, herbs, eye drops, creams, and over-the-counter medicines. Any problems you or family members have had with anesthetic medicines. Any bleeding problems you have. Any surgeries you have had. Any medical conditions you have. Whether you are pregnant or may be pregnant. What are the risks? Generally, this is a safe procedure. However, problems may occur,  including: Infection. Bleeding. Allergic reactions to medicines. Damage to nearby structures or organs. A gallstone remaining in the common bile duct. The common bile duct carries bile from the gallbladder to the small intestine. A bile leak from the liver or cystic duct after your gallbladder is removed. What happens before the procedure? When to stop eating and drinking Follow instructions from your health care provider about what you may eat and drink before your procedure. These may include: 8 hours before the procedure Stop eating most foods. Do not eat meat, fried foods, or fatty foods. Eat only light foods, such as toast or crackers. All liquids are okay except energy drinks and alcohol. 6 hours before the procedure Stop eating. Drink only clear liquids, such as water, clear fruit juice, black coffee, plain tea, and sports drinks. Do not drink energy drinks or alcohol. 2 hours before the procedure Stop drinking all liquids. You may be allowed to take medicines with small sips of water. If you do not follow your health care provider's instructions, your procedure may be delayed or canceled. Medicines Ask your health care provider about: Changing or stopping your regular medicines. This is especially important if you are taking diabetes medicines or blood thinners. Taking medicines such as aspirin and ibuprofen. These medicines can thin your blood. Do not take these medicines unless your health care provider tells you to take them. Taking over-the-counter medicines, vitamins, herbs, and supplements. General instructions If you will be going home right after the procedure, plan to have a responsible adult: Take you home from the hospital or clinic. You will not be allowed to drive. Care for you for the time you are   told. Do not use any products that contain nicotine or tobacco for at least 4 weeks before the procedure. These products include cigarettes, chewing tobacco, and vaping  devices, such as e-cigarettes. If you need help quitting, ask your health care provider. Ask your health care provider: How your surgery site will be marked. What steps will be taken to help prevent infection. These may include: Removing hair at the surgery site. Washing skin with a germ-killing soap. Taking antibiotic medicine. What happens during the procedure?  An IV will be inserted into one of your veins. You will be given one or both of the following: A medicine to help you relax (sedative). A medicine to make you fall asleep (general anesthetic). Your surgeon will make several small incisions in your abdomen. The laparoscope will be inserted through one of the small incisions. The camera on the laparoscope will send images to a monitor in the operating room. This lets your surgeon see inside your abdomen. A gas will be pumped into your abdomen. This will expand your abdomen to give the surgeon more room to perform the surgery. Other tools that are needed for the procedure will be inserted through the other incisions. The gallbladder will be removed through one of the incisions. Your common bile duct may be examined. If stones are found in the common bile duct, they may be removed. After your gallbladder has been removed, the incisions will be closed with stitches (sutures), staples, or skin glue. Your incisions will be covered with a bandage (dressing). The procedure may vary among health care providers and hospitals. What happens after the procedure? Your blood pressure, heart rate, breathing rate, and blood oxygen level will be monitored until you leave the hospital or clinic. You will be given medicines as needed to control your pain. You may have a drain placed in the incision. The drain will be removed a day or two after the procedure. Summary Minimally invasive cholecystectomy, also called laparoscopic cholecystectomy, is surgery to remove the gallbladder using small  incisions. Tell your health care provider about all the medical conditions you have and all the medicines you are taking for those conditions. Before the procedure, follow instructions about when to stop eating and drinking and changing or stopping medicines. Plan to have a responsible adult care for you for the time you are told after you leave the hospital or clinic. This information is not intended to replace advice given to you by your health care provider. Make sure you discuss any questions you have with your health care provider. Document Revised: 11/11/2020 Document Reviewed: 11/11/2020 Elsevier Patient Education  2023 [employer]  

## 2022-09-29 NOTE — Progress Notes (Signed)
09/29/2022  Reason for Visit:  Symptomatic cholelithiasis  Requesting Provider:  Joseph Berkshire, MD  History of Present Illness: Jeanette Sandoval is a 37 y.o. female presenting for evaluation of abdominal pain.  The patient presented to the ED on 05/30/22 with chest pain.  CTA showed  PE, but possible atypical mycobacterial infection and bronchiectasis.  She has been evaluated by cardiology and pulmonology.  She was started on Protonix and also had an ultrasound of the RUQ on 09/20/22 which showed cholelithiasis, likely a gallbladder full of stones.  She reports she's had multiple episodes of pain since January.  The pain starts in the back and radiates towards the bilateral upper quadrants.  It's not necessarily related to po intake and the episodes have been at random times.  She does get nausea and also feels bloated.  She has a referral with GI but her appointment is not until August.    Past Medical History: Past Medical History:  Diagnosis Date   Asthma    Bronchiectasis [facility])      Past Surgical History: Past Surgical History:  Procedure Laterality Date   APPENDECTOMY  1999   CESAREAN SECTION  2019   NASAL SINUS SURGERY  2014   WISDOM TOOTH EXTRACTION Bilateral 11/2020    Home Medications: Prior to Admission medications   Medication Sig Start Date End Date Taking? Authorizing Provider  albuterol (VENTOLIN HFA) 108 (90 Base) MCG/ACT inhaler Inhale 2 puffs into the lungs every 6 (six) hours as needed for wheezing or shortness of breath. 10/16/21  Yes Salena Saner, MD  budesonide-formoterol [facility]) 160-4.5 MCG/ACT inhaler Inhale 2 puffs into the lungs 2 (two) times daily. 10/16/21  Yes Salena Saner, MD  montelukast (SINGULAIR) 10 MG tablet TAKE 1 TABLET BY MOUTH EVERY DAY 02/10/22  Yes Salena Saner, MD  pantoprazole (PROTONIX) 40 MG tablet Take 1 tablet (40 mg total) by mouth daily. 08/09/22 12/07/22 Yes Jerrol Banana, MD  sertraline (ZOLOFT) 50 MG tablet Take 1  tablet (50 mg total) by mouth daily. 08/09/22  Yes Jerrol Banana, MD    Allergies: No Known Allergies  Social History:  reports that she has never smoked. She has never used smokeless tobacco. She reports that she does not currently use alcohol. She reports that she does not use drugs.   Family History: Family History  Problem Relation Age of Onset   Lupus Mother    Hypertension Father    Coronary artery disease Maternal Grandmother    Peripheral Artery Disease Maternal Grandmother    Liver cancer Paternal Grandmother    Stomach cancer Maternal Aunt    Ovarian cancer Maternal Aunt    Breast cancer Neg Hx     Review of Systems: Review of Systems  Constitutional:  Negative for chills and fever.  HENT:  Negative for hearing loss.   Respiratory:  Negative for shortness of breath.   Cardiovascular:  Negative for chest pain.  Gastrointestinal:  Positive for abdominal pain and nausea. Negative for vomiting.  Genitourinary:  Negative for dysuria.  Musculoskeletal:  Negative for myalgias.  Skin:  Negative for rash.  Neurological:  Negative for dizziness.  Psychiatric/Behavioral:  Negative for depression.     Physical Exam BP 116/77   Pulse 79   Temp 97.8 F (36.6 C) (Oral)   Ht 5\' 5"  (1.651 m)   Wt 143 lb 12.8 oz (65.2 kg)   SpO2 97%   BMI 23.93 kg/m  CONSTITUTIONAL: No acute distress, well nourished. HEENT:  Normocephalic, atraumatic, extraocular motion intact. NECK: Trachea is midline, and there is no jugular venous distension.  RESPIRATORY:  Lungs are clear, and breath sounds are equal bilaterally. Normal respiratory effort without pathologic use of accessory muscles. CARDIOVASCULAR: Heart is regular without murmurs, gallops, or rubs. GI: The abdomen is soft, non-distended, currently non-tender to palpation.  Negative Murphy's sign.  MUSCULOSKELETAL:  Normal muscle strength and tone in all four extremities.  No peripheral edema or cyanosis. SKIN: Skin turgor is  normal. There are no pathologic skin lesions.  NEUROLOGIC:  Motor and sensation is grossly normal.  Cranial nerves are grossly intact. PSYCH:  Alert and oriented to person, place and time. Affect is normal.  Laboratory Analysis: Labs from 05/30/2022: Sodium 137, potassium 3.8, chloride 103, CO2 24, BUN 18, creatinine 0.68.  Total bilirubin 0.6, AST 42, ALT 29, alkaline phosphatase 107, albumin 4.3.  WBC 10.8, hemoglobin 14.2, hematocrit 43.5, platelets 311.  Imaging: CTA chest on 05/30/2022: IMPRESSION: 1. Negative for acute pulmonary embolus. 2. Interval progression of multifocal tree-in-bud micro nodularity and peribronchovascular ground-glass attenuation airspace opacities compared to prior imaging from December of 2022. 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 3. Similar appearance of chronic fibrotic consolidation with associated bronchiectasis in the medial aspects of the lingula and right middle lobe. 4. Enlarged main pulmonary artery at 3 cm suggests pulmonary arterial hypertension. 5. Slightly increased prominence of mediastinal and right hilar nodal tissue which is almost certainly reactive to the chronic infectious/inflammatory process involving the lungs.  Ultrasound RUQ on 09/20/2022: IMPRESSION: Echogenicity at the level of the gallbladder with diffuse posterior acoustic shadowing, raising the possibility of extensive cholelithiasis within the gallbladder. No secondary signs of acute cholecystitis.  Assessment and Plan: This is a 37 y.o. female with symptomatic cholelithiasis.  -Discussed with the patient how the gallbladder works and how the stones can be causing episodes of pain.  Some of her symptoms are somewhat atypical in the location and how they're not necessarily related to po intake.  However, her ultrasound does show extensive cholelithiasis.  Discussed with her that we can offer surgery, but could not  guarantee that all her symptoms would resolve with cholecystectomy.  We could also wait until after she's been evaluated by GI, but that would mean waiting until after her appointment.  After further discussion, she would like to proceed with surgical management. --Discussed with her then the plan for a robotic assisted cholecystectomy.  Reviewed the surgery at length including the planned incisions, the risks of bleeding, infection, injury to surrounding structures, the use of ICG to better evaluate the biliary anatomy, that this would be an outpatient surgery, post-operative activity restrictions, pain control, and she's willing to proceed. --Will schedule her for surgery on 10/06/22.  All of her questions have been answered.  I spent 55 minutes dedicated to the care of this patient on the date of this encounter to include pre-visit review of records, face-to-face time with the patient discussing diagnosis and management, and any post-visit coordination of care.   Howie Ill, MD [facility]

## 2022-09-30 ENCOUNTER — Telehealth: Payer: Self-pay | Admitting: Surgery

## 2022-09-30 NOTE — Telephone Encounter (Signed)
Patient has been advised of Pre-Admission date/time, and Surgery date at [facility].  Surgery Date: 10/06/22 Preadmission Testing Date: 10/05/22 (phone 8a-1p)  Patient has been made aware to call [phone], between 1-3:00pm the day before surgery, to find out what time to arrive for surgery.

## 2022-10-05 ENCOUNTER — Encounter
Admission: RE | Admit: 2022-10-05 | Discharge: 2022-10-05 | Disposition: A | Discharge status: Home / Self Care | Payer: 59 | Source: Ambulatory Visit | Attending: Surgery | Admitting: Surgery

## 2022-10-05 VITALS — Ht 65.0 in | Wt 143.0 lb

## 2022-10-05 DIAGNOSIS — Z01812 Encounter for preprocedural laboratory examination: Secondary | ICD-10-CM

## 2022-10-05 HISTORY — DX: Gastro-esophageal reflux disease without esophagitis: K21.9

## 2022-10-05 NOTE — Patient Instructions (Addendum)
Your procedure is scheduled on: Wednesday, May 15 Report to the Registration Desk on the 1st floor of the [facility] To find out your arrival time, please call [phone] between 1PM - 3PM on: Tuesday, May 14 If your arrival time is 6:00 am, do not arrive before that time as the [facility] entrance doors do not open until 6:00 am.  REMEMBER: Instructions that are not followed completely may result in serious medical risk, up to and including death; or upon the discretion of your surgeon and anesthesiologist your surgery may need to be rescheduled.  Do not eat food after midnight the night before surgery.  No gum chewing or hard candies.  You may however, drink CLEAR liquids up to 2 hours before you are scheduled to arrive for your surgery. Do not drink anything within 2 hours of your scheduled arrival time.  Clear liquids include: - water  - apple juice without pulp - gatorade (not RED colors) - black coffee or tea (Do NOT add milk or creamers to the coffee or tea) Do NOT drink anything that is not on this list.  One week prior to surgery: starting today, May 14 Stop Anti-inflammatories (NSAIDS) such as Advil, Aleve, Ibuprofen, Motrin, Naproxen, Naprosyn and Aspirin based products such as Excedrin, Goody's Powder, BC Powder. Stop ANY OVER THE COUNTER supplements until after surgery. You may however, continue to take Tylenol if needed for pain up until the day of surgery.  Continue taking all prescribed medications   TAKE ONLY THESE MEDICATIONS THE MORNING OF SURGERY WITH A SIP OF WATER:  Symbicort inhaler Pantoprazole (Protonix) - (take one the night before and one on the morning of surgery - helps to prevent nausea after surgery.) Sertraline (Zoloft)  Use inhalers on the day of surgery and bring your albuterol inhaler to the hospital.  No Alcohol for 24 hours before or after surgery.  No Smoking including e-cigarettes for 24 hours before surgery.  No chewable tobacco  products for at least 6 hours before surgery.  No nicotine patches on the day of surgery.  Do not use any "recreational" drugs for at least a week (preferably 2 weeks) before your surgery.  Please be advised that the combination of cocaine and anesthesia may have negative outcomes, up to and including death. If you test positive for cocaine, your surgery will be cancelled.  On the morning of surgery brush your teeth with toothpaste and water, you may rinse your mouth with mouthwash if you wish. Do not swallow any toothpaste or mouthwash.  Use CHG Soap as directed on instruction sheet.  Do not wear jewelry, make-up, hairpins, clips or nail polish.  Do not wear lotions, powders, or perfumes.   Do not shave body hair from the neck down 48 hours before surgery.  Contact lenses, hearing aids and dentures may not be worn into surgery.  Do not bring valuables to the hospital. [facility] is not responsible for any missing/lost belongings or valuables.   Notify your doctor if there is any change in your medical condition (cold, fever, infection).  Wear comfortable clothing (specific to your surgery type) to the hospital.  After surgery, you can help prevent lung complications by doing breathing exercises.  Take deep breaths and cough every 1-2 hours. Your doctor may order a device called an Incentive Spirometer to help you take deep breaths. When coughing or sneezing, hold a pillow firmly against your incision with both hands. This is called "splinting." Doing this helps protect your  incision. It also decreases belly discomfort.  If you are being discharged the day of surgery, you will not be allowed to drive home. You will need a responsible individual to drive you home and stay with you for 24 hours after surgery.   If you are taking public transportation, you will need to have a responsible individual with you.  Please call the Pre-admissions Testing Dept. at [phone] if you have  any questions about these instructions.  Surgery Visitation Policy:  Patients having surgery or a procedure may have two visitors.  Children under the age of 79 must have an adult with them who is not the patient.     Preparing for Surgery with CHLORHEXIDINE GLUCONATE (CHG) Soap  Chlorhexidine Gluconate (CHG) Soap  o An antiseptic cleaner that kills germs and bonds with the skin to continue killing germs even after washing  o Used for showering the night before surgery and morning of surgery  Before surgery, you can play an important role by reducing the number of germs on your skin.  CHG (Chlorhexidine gluconate) soap is an antiseptic cleanser which kills germs and bonds with the skin to continue killing germs even after washing.  Please do not use if you have an allergy to CHG or antibacterial soaps. If your skin becomes reddened/irritated stop using the CHG.  1. Shower the NIGHT BEFORE SURGERY and the MORNING OF SURGERY with CHG soap.  2. If you choose to wash your hair, wash your hair first as usual with your normal shampoo.  3. After shampooing, rinse your hair and body thoroughly to remove the shampoo.  4. Use CHG as you would any other liquid soap. You can apply CHG directly to the skin and wash gently with a scrungie or a clean washcloth.  5. Apply the CHG soap to your body only from the neck down. Do not use on open wounds or open sores. Avoid contact with your eyes, ears, mouth, and genitals (private parts). Wash face and genitals (private parts) with your normal soap.  6. Wash thoroughly, paying special attention to the area where your surgery will be performed.  7. Thoroughly rinse your body with warm water.  8. Do not shower/wash with your normal soap after using and rinsing off the CHG soap.  9. Pat yourself dry with a clean towel.  10. Wear clean pajamas to bed the night before surgery.  12. Place clean sheets on your bed the night of your first shower and do  not sleep with pets.  13. Shower again with the CHG soap on the day of surgery prior to arriving at the hospital.  14. Do not apply any deodorants/lotions/powders.  15. Please wear clean clothes to the hospital.

## 2022-10-06 ENCOUNTER — Ambulatory Visit: Payer: 59 | Admitting: Urgent Care

## 2022-10-06 ENCOUNTER — Other Ambulatory Visit: Payer: Self-pay

## 2022-10-06 ENCOUNTER — Ambulatory Visit
Admission: RE | Admit: 2022-10-06 | Discharge: 2022-10-06 | Disposition: A | Discharge status: Home / Self Care | Payer: 59 | Attending: Surgery | Admitting: Surgery

## 2022-10-06 ENCOUNTER — Encounter: Payer: Self-pay | Admitting: Surgery

## 2022-10-06 ENCOUNTER — Encounter
Admission: RE | Disposition: A | Discharge status: Home / Self Care | Payer: Self-pay | Source: Home / Self Care | Attending: Surgery

## 2022-10-06 DIAGNOSIS — K8020 Calculus of gallbladder without cholecystitis without obstruction: Secondary | ICD-10-CM

## 2022-10-06 DIAGNOSIS — F419 Anxiety disorder, unspecified: Secondary | ICD-10-CM | POA: Diagnosis not present

## 2022-10-06 DIAGNOSIS — J45909 Unspecified asthma, uncomplicated: Secondary | ICD-10-CM | POA: Diagnosis not present

## 2022-10-06 DIAGNOSIS — K8010 Calculus of gallbladder with chronic cholecystitis without obstruction: Secondary | ICD-10-CM | POA: Insufficient documentation

## 2022-10-06 DIAGNOSIS — Z01812 Encounter for preprocedural laboratory examination: Secondary | ICD-10-CM

## 2022-10-06 DIAGNOSIS — K219 Gastro-esophageal reflux disease without esophagitis: Secondary | ICD-10-CM | POA: Insufficient documentation

## 2022-10-06 DIAGNOSIS — J479 Bronchiectasis, uncomplicated: Secondary | ICD-10-CM | POA: Insufficient documentation

## 2022-10-06 DIAGNOSIS — F32A Depression, unspecified: Secondary | ICD-10-CM | POA: Insufficient documentation

## 2022-10-06 HISTORY — DX: Calculus of gallbladder without cholecystitis without obstruction: K80.20

## 2022-10-06 LAB — POCT PREGNANCY, URINE: Preg Test, Ur: NEGATIVE

## 2022-10-06 SURGERY — CHOLECYSTECTOMY, ROBOT-ASSISTED, LAPAROSCOPIC
Anesthesia: General | Site: Abdomen

## 2022-10-06 MED ORDER — MIDAZOLAM HCL 2 MG/2ML IJ SOLN
INTRAMUSCULAR | Status: DC | PRN
  Administered 2022-10-06: 2 mg via INTRAVENOUS

## 2022-10-06 MED ORDER — LACTATED RINGERS IV SOLN: INTRAVENOUS | Status: DC

## 2022-10-06 MED ORDER — ACETAMINOPHEN 500 MG PO TABS: 1000.0000 mg | ORAL_TABLET | Freq: Four times a day (QID) | ORAL | Status: DC | PRN

## 2022-10-06 MED ORDER — PROPOFOL 10 MG/ML IV BOLUS
INTRAVENOUS | Status: AC
  Filled 2022-10-06: qty 20

## 2022-10-06 MED ORDER — IBUPROFEN 600 MG PO TABS
600.0000 mg | ORAL_TABLET | Freq: Three times a day (TID) | ORAL | 1 refills | Status: DC | PRN
Start: 2022-10-06 — End: 2023-01-11

## 2022-10-06 MED ORDER — GLYCOPYRROLATE 0.2 MG/ML IJ SOLN
INTRAMUSCULAR | Status: DC | PRN
  Administered 2022-10-06: .2 mg via INTRAVENOUS

## 2022-10-06 MED ORDER — PROPOFOL 1000 MG/100ML IV EMUL
INTRAVENOUS | Status: AC
  Filled 2022-10-06: qty 100

## 2022-10-06 MED ORDER — CHLORHEXIDINE GLUCONATE 0.12 % MT SOLN
OROMUCOSAL | Status: AC
  Filled 2022-10-06: qty 15

## 2022-10-06 MED ORDER — ALBUTEROL SULFATE (2.5 MG/3ML) 0.083% IN NEBU
2.5000 mg | INHALATION_SOLUTION | Freq: Once | RESPIRATORY_TRACT | Status: AC
  Administered 2022-10-06: 2.5 mg via RESPIRATORY_TRACT

## 2022-10-06 MED ORDER — BUPIVACAINE HCL (PF) 0.25 % IJ SOLN
INTRAMUSCULAR | Status: AC
  Filled 2022-10-06: qty 30

## 2022-10-06 MED ORDER — ALBUTEROL SULFATE (2.5 MG/3ML) 0.083% IN NEBU
INHALATION_SOLUTION | RESPIRATORY_TRACT | Status: AC
  Filled 2022-10-06: qty 3

## 2022-10-06 MED ORDER — CEFAZOLIN SODIUM-DEXTROSE 2-4 GM/100ML-% IV SOLN
2.0000 g | INTRAVENOUS | Status: AC
  Administered 2022-10-06: 2 g via INTRAVENOUS

## 2022-10-06 MED ORDER — ACETAMINOPHEN 500 MG PO TABS
ORAL_TABLET | ORAL | Status: AC
  Filled 2022-10-06: qty 2

## 2022-10-06 MED ORDER — GABAPENTIN 300 MG PO CAPS
ORAL_CAPSULE | ORAL | Status: AC
  Filled 2022-10-06: qty 1

## 2022-10-06 MED ORDER — MIDAZOLAM HCL 2 MG/2ML IJ SOLN
INTRAMUSCULAR | Status: AC
  Filled 2022-10-06: qty 2

## 2022-10-06 MED ORDER — CHLORHEXIDINE GLUCONATE 0.12 % MT SOLN
15.0000 mL | Freq: Once | OROMUCOSAL | Status: AC
  Administered 2022-10-06: 15 mL via OROMUCOSAL

## 2022-10-06 MED ORDER — CHLORHEXIDINE GLUCONATE CLOTH 2 % EX PADS: 6.0000 | MEDICATED_PAD | Freq: Once | CUTANEOUS | Status: DC

## 2022-10-06 MED ORDER — DROPERIDOL 2.5 MG/ML IJ SOLN: 0.6250 mg | Freq: Once | INTRAMUSCULAR | Status: DC | PRN

## 2022-10-06 MED ORDER — INDOCYANINE GREEN 25 MG IV SOLR
2.5000 mg | INTRAVENOUS | Status: AC
  Administered 2022-10-06: 2.5 mg via INTRAVENOUS
  Filled 2022-10-06: qty 10
  Filled 2022-10-06: qty 1

## 2022-10-06 MED ORDER — EPINEPHRINE PF 1 MG/ML IJ SOLN
INTRAMUSCULAR | Status: AC
  Filled 2022-10-06: qty 1

## 2022-10-06 MED ORDER — FENTANYL CITRATE (PF) 100 MCG/2ML IJ SOLN
INTRAMUSCULAR | Status: AC
  Filled 2022-10-06: qty 2

## 2022-10-06 MED ORDER — OXYCODONE HCL 5 MG PO TABS
5.0000 mg | ORAL_TABLET | ORAL | 0 refills | Status: DC | PRN
Start: 2022-10-06 — End: 2022-11-02

## 2022-10-06 MED ORDER — PROPOFOL 10 MG/ML IV BOLUS
INTRAVENOUS | Status: DC | PRN
  Administered 2022-10-06: 120 mg via INTRAVENOUS
  Administered 2022-10-06: 130 ug/kg/min via INTRAVENOUS

## 2022-10-06 MED ORDER — ROCURONIUM BROMIDE 100 MG/10ML IV SOLN
INTRAVENOUS | Status: DC | PRN
  Administered 2022-10-06: 50 mg via INTRAVENOUS
  Administered 2022-10-06: 10 mg via INTRAVENOUS
  Administered 2022-10-06 (×2): 5 mg via INTRAVENOUS

## 2022-10-06 MED ORDER — 0.9 % SODIUM CHLORIDE (POUR BTL) OPTIME
TOPICAL | Status: DC | PRN
  Administered 2022-10-06: 500 mL

## 2022-10-06 MED ORDER — FENTANYL CITRATE (PF) 100 MCG/2ML IJ SOLN
INTRAMUSCULAR | Status: DC | PRN
  Administered 2022-10-06 (×2): 50 ug via INTRAVENOUS

## 2022-10-06 MED ORDER — ACETAMINOPHEN 500 MG PO TABS
1000.0000 mg | ORAL_TABLET | ORAL | Status: AC
  Administered 2022-10-06: 1000 mg via ORAL

## 2022-10-06 MED ORDER — DEXAMETHASONE SODIUM PHOSPHATE 10 MG/ML IJ SOLN
INTRAMUSCULAR | Status: DC | PRN
  Administered 2022-10-06: 5 mg via INTRAVENOUS

## 2022-10-06 MED ORDER — GABAPENTIN 300 MG PO CAPS
300.0000 mg | ORAL_CAPSULE | ORAL | Status: AC
  Administered 2022-10-06: 300 mg via ORAL

## 2022-10-06 MED ORDER — LIDOCAINE HCL (CARDIAC) PF 100 MG/5ML IV SOSY
PREFILLED_SYRINGE | INTRAVENOUS | Status: DC | PRN
  Administered 2022-10-06: 30 mg via INTRAVENOUS

## 2022-10-06 MED ORDER — LIDOCAINE HCL (PF) 2 % IJ SOLN
INTRAMUSCULAR | Status: AC
  Filled 2022-10-06: qty 5

## 2022-10-06 MED ORDER — CEFAZOLIN SODIUM-DEXTROSE 2-4 GM/100ML-% IV SOLN
INTRAVENOUS | Status: AC
  Filled 2022-10-06: qty 100

## 2022-10-06 MED ORDER — BUPIVACAINE-EPINEPHRINE (PF) 0.25% -1:200000 IJ SOLN
INTRAMUSCULAR | Status: DC | PRN
  Administered 2022-10-06: 30 mL

## 2022-10-06 MED ORDER — HYDROMORPHONE HCL 1 MG/ML IJ SOLN
INTRAMUSCULAR | Status: AC
  Filled 2022-10-06: qty 1

## 2022-10-06 MED ORDER — ONDANSETRON HCL 4 MG/2ML IJ SOLN
INTRAMUSCULAR | Status: DC | PRN
  Administered 2022-10-06: 4 mg via INTRAVENOUS

## 2022-10-06 MED ORDER — ORAL CARE MOUTH RINSE: 15.0000 mL | Freq: Once | OROMUCOSAL | Status: AC

## 2022-10-06 MED ORDER — KETOROLAC TROMETHAMINE 30 MG/ML IJ SOLN
INTRAMUSCULAR | Status: DC | PRN
  Administered 2022-10-06: 30 mg via INTRAVENOUS

## 2022-10-06 MED ORDER — SUGAMMADEX SODIUM 200 MG/2ML IV SOLN
INTRAVENOUS | Status: DC | PRN
  Administered 2022-10-06: 200 mg via INTRAVENOUS

## 2022-10-06 MED ORDER — DEXMEDETOMIDINE HCL IN NACL 200 MCG/50ML IV SOLN
INTRAVENOUS | Status: DC | PRN
  Administered 2022-10-06: 4 ug via INTRAVENOUS
  Administered 2022-10-06: 8 ug via INTRAVENOUS

## 2022-10-06 MED ORDER — FENTANYL CITRATE (PF) 100 MCG/2ML IJ SOLN
25.0000 ug | INTRAMUSCULAR | Status: DC | PRN
  Administered 2022-10-06 (×2): 25 ug via INTRAVENOUS
  Administered 2022-10-06: 50 ug via INTRAVENOUS
  Administered 2022-10-06: 25 ug via INTRAVENOUS

## 2022-10-06 SURGICAL SUPPLY — 53 items
ADH SKN CLS APL DERMABOND .7 (GAUZE/BANDAGES/DRESSINGS) ×2
BAG PRESSURE INF REUSE 1000 (BAG) IMPLANT
CANNULA CAP OBTURATR AIRSEAL 8 (CAP) IMPLANT
CAUTERY HOOK MNPLR 1.6 DVNC XI (INSTRUMENTS) ×2 IMPLANT
CLIP LIGATING HEM O LOK PURPLE (MISCELLANEOUS) IMPLANT
CLIP LIGATING HEMO O LOK GREEN (MISCELLANEOUS) ×2 IMPLANT
DERMABOND ADVANCED .7 DNX12 (GAUZE/BANDAGES/DRESSINGS) ×2 IMPLANT
DRAPE ARM DVNC X/XI (DISPOSABLE) ×8 IMPLANT
DRAPE COLUMN DVNC XI (DISPOSABLE) ×2 IMPLANT
ELECT CAUTERY BLADE TIP 2.5 (TIP) ×2
ELECT REM PT RETURN 9FT ADLT (ELECTROSURGICAL) ×2
ELECTRODE CAUTERY BLDE TIP 2.5 (TIP) ×2 IMPLANT
ELECTRODE REM PT RTRN 9FT ADLT (ELECTROSURGICAL) ×2 IMPLANT
FORCEPS BPLR R/ABLATION 8 DVNC (INSTRUMENTS) ×2 IMPLANT
FORCEPS PROGRASP DVNC XI (FORCEP) ×2 IMPLANT
GLOVE SURG SYN 7.0 (GLOVE) ×4 IMPLANT
GLOVE SURG SYN 7.0 PF PI (GLOVE) ×4 IMPLANT
GLOVE SURG SYN 7.5  E (GLOVE) ×4
GLOVE SURG SYN 7.5 E (GLOVE) ×4 IMPLANT
GLOVE SURG SYN 7.5 PF PI (GLOVE) ×4 IMPLANT
GOWN STRL REUS W/ TWL LRG LVL3 (GOWN DISPOSABLE) ×8 IMPLANT
GOWN STRL REUS W/TWL LRG LVL3 (GOWN DISPOSABLE) ×8
IRRIGATOR SUCT 8 DISP DVNC XI (IRRIGATION / IRRIGATOR) IMPLANT
IV NS 1000ML (IV SOLUTION)
IV NS 1000ML BAXH (IV SOLUTION) IMPLANT
KIT PINK PAD W/HEAD ARE REST (MISCELLANEOUS) ×2
KIT PINK PAD W/HEAD ARM REST (MISCELLANEOUS) ×2 IMPLANT
LABEL OR SOLS (LABEL) ×2 IMPLANT
MANIFOLD NEPTUNE II (INSTRUMENTS) ×2 IMPLANT
NDL DRIVE SUT CUT DVNC (INSTRUMENTS) IMPLANT
NDL HYPO 22X1.5 SAFETY MO (MISCELLANEOUS) ×2 IMPLANT
NEEDLE DRIVE SUT CUT DVNC (INSTRUMENTS) ×2 IMPLANT
NEEDLE HYPO 22X1.5 SAFETY MO (MISCELLANEOUS) ×2 IMPLANT
NS IRRIG 500ML POUR BTL (IV SOLUTION) ×2 IMPLANT
OBTURATOR OPTICAL STND 8 DVNC (TROCAR) ×2
OBTURATOR OPTICALSTD 8 DVNC (TROCAR) ×2 IMPLANT
PACK LAP CHOLECYSTECTOMY (MISCELLANEOUS) ×2 IMPLANT
PENCIL SMOKE EVACUATOR (MISCELLANEOUS) ×2 IMPLANT
SEAL UNIV 5-12 XI (MISCELLANEOUS) ×8 IMPLANT
SET TUBE FILTERED XL AIRSEAL (SET/KITS/TRAYS/PACK) IMPLANT
SET TUBE SMOKE EVAC HIGH FLOW (TUBING) ×2 IMPLANT
SOL ELECTROSURG ANTI STICK (MISCELLANEOUS) ×2
SOLUTION ELECTROSURG ANTI STCK (MISCELLANEOUS) ×2 IMPLANT
SPIKE FLUID TRANSFER (MISCELLANEOUS) ×2 IMPLANT
SPONGE T-LAP 18X18 ~~LOC~~+RFID (SPONGE) IMPLANT
SPONGE T-LAP 4X18 ~~LOC~~+RFID (SPONGE) ×2 IMPLANT
SUT MNCRL AB 4-0 PS2 18 (SUTURE) ×2 IMPLANT
SUT SILK 3 0 SH 30 (SUTURE) IMPLANT
SUT VIC AB 3-0 SH 27 (SUTURE)
SUT VIC AB 3-0 SH 27X BRD (SUTURE) IMPLANT
SUT VICRYL 0 UR6 27IN ABS (SUTURE) ×4 IMPLANT
SYS BAG RETRIEVAL 10MM (BASKET) ×2
SYSTEM BAG RETRIEVAL 10MM (BASKET) ×2 IMPLANT

## 2022-10-06 NOTE — Discharge Instructions (Addendum)
Discharge Instructions: 1.  Patient may shower, but do not scrub wounds heavily and dab dry only. 2.  Do not submerge wounds in pool/tub until fully healed. 3.  Do not apply ointments or hydrogen peroxide to the wounds. 4.  May apply ice packs to the wounds for comfort. 5.  No heavy lifting or pushing of more than 10-15 lbs for 4 weeks. 6.  Do not drive while taking narcotics for pain control.  Prior to driving, make sure you are able to rotate right and left to look at blindspots without significant pain or discomfort.    AMBULATORY SURGERY  DISCHARGE INSTRUCTIONS   The drugs that you were given will stay in your system until tomorrow so for the next 24 hours you should not:  Drive an automobile Make any legal decisions Drink any alcoholic beverage   You may resume regular meals tomorrow.  Today it is better to start with liquids and gradually work up to solid foods.  You may eat anything you prefer, but it is better to start with liquids, then soup and crackers, and gradually work up to solid foods.   Please notify your doctor immediately if you have any unusual bleeding, trouble breathing, redness and pain at the surgery site, drainage, fever, or pain not relieved by medication.      Additional Instructions:

## 2022-10-06 NOTE — Op Note (Signed)
  Procedure Date:  10/06/2022  Pre-operative Diagnosis:  Symptomatic cholelithiasis  Post-operative Diagnosis: Symptomatic cholelithiasis  Procedure:  Robotic assisted cholecystectomy with ICG FireFly cholangiogram  Surgeon:  Howie Ill, MD  Anesthesia:  General endotracheal  Estimated Blood Loss:  10 ml  Specimens:  gallbladder  Complications:  Potential serosal burn at the pylorus due to dissection with cautery.  As a precaution, reinforced the area with 3-0 Silk lembert sutures.  Indications for Procedure:  This is a 37 y.o. female who presents with abdominal pain and workup revealing symptomatic cholelithiasis.  The benefits, complications, treatment options, and expected outcomes were discussed with the patient. The risks of bleeding, infection, recurrence of symptoms, failure to resolve symptoms, bile duct damage, bile duct leak, retained common bile duct stone, bowel injury, and need for further procedures were all discussed with the patient and she was willing to proceed.  Description of Procedure: The patient was correctly identified in the preoperative area and brought into the operating room.  The patient was placed supine with VTE prophylaxis in place.  Appropriate time-outs were performed.  Anesthesia was induced and the patient was intubated.  Appropriate antibiotics were infused.  The abdomen was prepped and draped in a sterile fashion. An infraumbilical incision was made. A cutdown technique was used to enter the abdominal cavity without injury, and a 12 mm robotic port was inserted.  Pneumoperitoneum was obtained with appropriate opening pressures.  Three 8-mm ports were placed in the mid abdomen at the level of the umbilicus under direct visualization.  The DaVinci platform was docked, camera targeted, and instruments were placed under direct visualization.  The gallbladder was identified.  The fundus was grasped and retracted cephalad.  Adhesions were lysed bluntly  and with electrocautery. There was a possible serosal burn injury at the pylorus, and as a precaution, the area was reinforced with 3-0 Silk lembert sutures.  The infundibulum was grasped and retracted laterally, exposing the peritoneum overlying the gallbladder.  This was incised with electrocautery and extended on either side of the gallbladder.  FireFly cholangiogram was then obtained, and we were able to clearly identify the cystic duct and common bile duct.  There was flow of ICG from the liver to the common hepatic duct, cystic duct, gallbladder, common bile duct, and into the duodenum, showing no gross obstruction.  The cystic duct and cystic artery were carefully dissected with combination of cautery and blunt dissection.  Both were clipped twice proximally and once distally, cutting in between.  The gallbladder was taken from the gallbladder fossa in a retrograde fashion with electrocautery. The gallbladder was placed in an Endocatch bag. The liver bed was inspected and any bleeding was controlled with electrocautery. The right upper quadrant was then inspected again revealing intact clips, no bleeding, and no ductal injury.  The 8 mm ports were removed under direct visualization and the 12 mm port was removed.  The Endocatch bag was brought out via the umbilical incision. The fascial opening was closed using 0 vicryl suture.  Local anesthetic was infused in all incisions and the incisions were closed with 4-0 Monocryl.  The wounds were cleaned and sealed with DermaBond.  The patient was emerged from anesthesia and extubated and brought to the recovery room for further management.  The patient tolerated the procedure well and all counts were correct at the end of the case.   Howie Ill, MD

## 2022-10-06 NOTE — Anesthesia Procedure Notes (Signed)
Procedure Name: Intubation Date/Time: 10/06/2022 3:15 PM  Performed by: Merlene Pulling, CRNAPre-anesthesia Checklist: Patient identified, Patient being monitored, Timeout performed, Emergency Drugs available and Suction available Patient Re-evaluated:Patient Re-evaluated prior to induction Oxygen Delivery Method: Circle system utilized Preoxygenation: Pre-oxygenation with 100% oxygen Induction Type: IV induction Ventilation: Mask ventilation without difficulty Laryngoscope Size: Mac and 3 Grade View: Grade II Tube type: Oral Tube size: 7.0 mm Number of attempts: 1 Airway Equipment and Method: Stylet Placement Confirmation: ETT inserted through vocal cords under direct vision, positive ETCO2 and breath sounds checked- equal and bilateral Secured at: 21 cm Tube secured with: Tape Dental Injury: Teeth and Oropharynx as per pre-operative assessment

## 2022-10-06 NOTE — Transfer of Care (Signed)
Immediate Anesthesia Transfer of Care Note  Patient: Jeanette Sandoval  Procedure(s) Performed: XI ROBOTIC ASSISTED LAPAROSCOPIC CHOLECYSTECTOMY (Abdomen) INDOCYANINE GREEN FLUORESCENCE IMAGING (ICG)  Patient Location: PACU  Anesthesia Type:General  Level of Consciousness: drowsy  Airway & Oxygen Therapy: Patient Spontanous Breathing and Patient connected to face mask oxygen  Post-op Assessment: Report given to RN and Post -op Vital signs reviewed and stable  Post vital signs: Reviewed  Last Vitals:  Vitals Value Taken Time  BP 100/58 10/06/22 1708  Temp 35F   Pulse 65 10/06/22 1712  Resp 16 10/06/22 1712  SpO2 100 % 10/06/22 1712  Vitals shown include unvalidated device data.  Last Pain:  Vitals:   10/06/22 1329  TempSrc: Tympanic  PainSc: 0-No pain         Complications: No notable events documented.

## 2022-10-06 NOTE — Anesthesia Postprocedure Evaluation (Signed)
Anesthesia Post Note  Patient: Jeanette Sandoval  Procedure(s) Performed: XI ROBOTIC ASSISTED LAPAROSCOPIC CHOLECYSTECTOMY (Abdomen) INDOCYANINE GREEN FLUORESCENCE IMAGING (ICG)  Patient location during evaluation: PACU Anesthesia Type: General Level of consciousness: awake and alert, oriented and patient cooperative Pain management: pain level controlled Vital Signs Assessment: post-procedure vital signs reviewed and stable Respiratory status: spontaneous breathing, nonlabored ventilation and respiratory function stable Cardiovascular status: blood pressure returned to baseline and stable Postop Assessment: adequate PO intake Anesthetic complications: no   No notable events documented.   Last Vitals:  Vitals:   10/06/22 1755 10/06/22 1811  BP:  119/76  Pulse: 90 75  Resp: 20 16  Temp:  (!) 36.1 C  SpO2: 100% 100%    Last Pain:  Vitals:   10/06/22 1811  TempSrc: Temporal  PainSc: 0-No pain                 Reed Breech

## 2022-10-06 NOTE — Interval H&P Note (Signed)
History and Physical Interval Note:  10/06/2022 2:32 PM  Jeanette Sandoval  has presented today for surgery, with the diagnosis of cholelithiasis.  The various methods of treatment have been discussed with the patient and family. After consideration of risks, benefits and other options for treatment, the patient has consented to  Procedure(s): XI ROBOTIC ASSISTED LAPAROSCOPIC CHOLECYSTECTOMY (N/A) INDOCYANINE GREEN FLUORESCENCE IMAGING (ICG) (N/A) as a surgical intervention.  The patient's history has been reviewed, patient examined, no change in status, stable for surgery.  I have reviewed the patient's chart and labs.  Questions were answered to the patient's satisfaction.     Tremane Spurgeon

## 2022-10-06 NOTE — Anesthesia Preprocedure Evaluation (Signed)
Anesthesia Evaluation  Patient identified by MRN, date of birth, ID band Patient awake    Reviewed: Allergy & Precautions, H&P , NPO status , Patient's Chart, lab work & pertinent test results, reviewed documented beta blocker date and time   History of Anesthesia Complications (+) PONV and history of anesthetic complications  Airway Mallampati: III  TM Distance: >3 FB Neck ROM: full    Dental  (+) Dental Advidsory Given, Teeth Intact, Missing   Pulmonary neg shortness of breath, asthma , neg recent URI   Pulmonary exam normal breath sounds clear to auscultation       Cardiovascular Exercise Tolerance: Good negative cardio ROS Normal cardiovascular exam Rhythm:regular Rate:Normal     Neuro/Psych  PSYCHIATRIC DISORDERS Anxiety Depression    negative neurological ROS     GI/Hepatic Neg liver ROS,GERD  ,,  Endo/Other  negative endocrine ROS    Renal/GU negative Renal ROS  negative genitourinary   Musculoskeletal   Abdominal   Peds  Hematology negative hematology ROS (+)   Anesthesia Other Findings Past Medical History: No date: Asthma No date: Bronchiectasis (HCC) No date: GERD (gastroesophageal reflux disease)   Reproductive/Obstetrics negative OB ROS                             Anesthesia Physical Anesthesia Plan  ASA: 2  Anesthesia Plan: General   Post-op Pain Management:    Induction: Intravenous  PONV Risk Score and Plan: 4 or greater and Propofol infusion, TIVA, Midazolam, Ondansetron, Dexamethasone and Treatment may vary due to age or medical condition  Airway Management Planned: Oral ETT  Additional Equipment:   Intra-op Plan:   Post-operative Plan: Extubation in OR  Informed Consent: I have reviewed the patients History and Physical, chart, labs and discussed the procedure including the risks, benefits and alternatives for the proposed anesthesia with the patient  or authorized representative who has indicated his/her understanding and acceptance.     Dental Advisory Given  Plan Discussed with: Anesthesiologist, CRNA and Surgeon  Anesthesia Plan Comments:        Anesthesia Quick Evaluation

## 2022-10-08 LAB — SURGICAL PATHOLOGY

## 2022-10-09 ENCOUNTER — Emergency Department: Payer: 59

## 2022-10-09 ENCOUNTER — Other Ambulatory Visit: Payer: Self-pay

## 2022-10-09 ENCOUNTER — Emergency Department
Admission: EM | Admit: 2022-10-09 | Discharge: 2022-10-09 | Disposition: A | Discharge status: Home / Self Care | Payer: 59 | Attending: Emergency Medicine | Admitting: Emergency Medicine

## 2022-10-09 DIAGNOSIS — T889XXA Complication of surgical and medical care, unspecified, initial encounter: Secondary | ICD-10-CM | POA: Diagnosis present

## 2022-10-09 DIAGNOSIS — Z9049 Acquired absence of other specified parts of digestive tract: Secondary | ICD-10-CM | POA: Diagnosis not present

## 2022-10-09 DIAGNOSIS — K5900 Constipation, unspecified: Secondary | ICD-10-CM

## 2022-10-09 DIAGNOSIS — T8141XA Infection following a procedure, superficial incisional surgical site, initial encounter: Secondary | ICD-10-CM | POA: Diagnosis not present

## 2022-10-09 LAB — CBC WITH DIFFERENTIAL/PLATELET
Abs Immature Granulocytes: 0.05 10*3/uL (ref 0.00–0.07)
Basophils Absolute: 0.1 10*3/uL (ref 0.0–0.1)
Basophils Relative: 1 %
Eosinophils Absolute: 0.2 10*3/uL (ref 0.0–0.5)
Eosinophils Relative: 2 %
HCT: 43.6 % (ref 36.0–46.0)
Hemoglobin: 13.9 g/dL (ref 12.0–15.0)
Immature Granulocytes: 0 %
Lymphocytes Relative: 18 %
Lymphs Abs: 2.3 10*3/uL (ref 0.7–4.0)
MCH: 28.6 pg (ref 26.0–34.0)
MCHC: 31.9 g/dL (ref 30.0–36.0)
MCV: 89.7 fL (ref 80.0–100.0)
Monocytes Absolute: 0.5 10*3/uL (ref 0.1–1.0)
Monocytes Relative: 4 %
Neutro Abs: 9.4 10*3/uL — ABNORMAL HIGH (ref 1.7–7.7)
Neutrophils Relative %: 75 %
Platelets: 284 10*3/uL (ref 150–400)
RBC: 4.86 MIL/uL (ref 3.87–5.11)
RDW: 14.9 % (ref 11.5–15.5)
WBC: 12.6 10*3/uL — ABNORMAL HIGH (ref 4.0–10.5)
nRBC: 0 % (ref 0.0–0.2)

## 2022-10-09 LAB — COMPREHENSIVE METABOLIC PANEL
ALT: 80 U/L — ABNORMAL HIGH (ref 0–44)
AST: 67 U/L — ABNORMAL HIGH (ref 15–41)
Albumin: 4.5 g/dL (ref 3.5–5.0)
Alkaline Phosphatase: 100 U/L (ref 38–126)
Anion gap: 9 (ref 5–15)
BUN: 15 mg/dL (ref 6–20)
CO2: 27 mmol/L (ref 22–32)
Calcium: 9.4 mg/dL (ref 8.9–10.3)
Chloride: 103 mmol/L (ref 98–111)
Creatinine, Ser: 0.72 mg/dL (ref 0.44–1.00)
GFR, Estimated: >60 mL/min (ref >60)
Glucose, Bld: 89 mg/dL (ref 70–99)
Potassium: 4.2 mmol/L (ref 3.5–5.1)
Sodium: 139 mmol/L (ref 135–145)
Total Bilirubin: 0.7 mg/dL (ref 0.3–1.2)
Total Protein: 8.3 g/dL — ABNORMAL HIGH (ref 6.5–8.1)

## 2022-10-09 MED ORDER — AMOXICILLIN-POT CLAVULANATE 875-125 MG PO TABS
1.0000 | ORAL_TABLET | Freq: Once | ORAL | Status: AC
  Administered 2022-10-09: 1 via ORAL
  Filled 2022-10-09: qty 1

## 2022-10-09 MED ORDER — AMOXICILLIN-POT CLAVULANATE 875-125 MG PO TABS: 1.0000 | ORAL_TABLET | Freq: Two times a day (BID) | ORAL | 0 refills | Status: AC

## 2022-10-09 NOTE — Discharge Instructions (Addendum)
Please anticipate an appointment on either Monday Tuesday or Wednesday of this week with Dr. Aleen Campi or his PA Raphael Gibney.  Lease call the clinic Monday morning to get this scheduled.

## 2022-10-09 NOTE — ED Provider Notes (Signed)
[facility] Provider Note    Event Date/Time   First MD Initiated Contact with Patient 10/09/22 1323     (approximate)   History   Post-op Problem and Constipation   HPI  Jeanette Sandoval is a 37 y.o. female recent cholecystectomy  The patient noticed today that she is having a slight amount of yellow at the edge of one of her incision sites and also slight redness.  Particularly the site by her bellybutton  She has had some pain after surgery but reports that that is been well-controlled with her medication, she does not believe that her pain is worsening in any way.  She has not had any fevers or chills.  She has had somewhat decreased appetite but still drinking water and food.  She is not yet passing gas and has not yet had a bowel movement and is using MiraLAX      Physical Exam   Triage Vital Signs: ED Triage Vitals  Enc Vitals Group     BP 10/09/22 1302 124/78     Pulse Rate 10/09/22 1302 91     Resp 10/09/22 1302 20     Temp 10/09/22 1302 98.2 F (36.8 C)     Temp Source 10/09/22 1302 Oral     SpO2 10/09/22 1302 96 %     Weight 10/09/22 1304 143 lb 4.8 oz (65 kg)     Height 10/09/22 1304 5\' 6"  (1.676 m)     Head Circumference --      Peak Flow --      Pain Score 10/09/22 1303 3     Pain Loc --      Pain Edu? --      Excl. in GC? --     Most recent vital signs: Vitals:   10/09/22 1302  BP: 124/78  Pulse: 91  Resp: 20  Temp: 98.2 F (36.8 C)  SpO2: 96%     General: Awake, no distress.  She and her friend at the bedside both very pleasant. CV:  Good peripheral perfusion.  Resp:  Normal effort.  Abd:  No distention.  Abdomen is soft.  Very mild tenderness without rebound or guarding.  Very mild tenderness, in keeping with expected tenderness postsurgery.  There is no peritonitis there is no focal areas of pain.  She reports the pain she was experiencing in her right upper abdomen has gone away after the surgery.  She has 4  trocar sites.  All sites appear to have just slight amount of ecchymosis expected around them, they are glued.  This site periumbilical has no expressible purulence, but there is a slight perhaps the size of a pinhead area of slight yellow underneath the glue.  Does not seem to be particularly tender and there is no surrounding erythema just a small amount of ecchymosis.  The remaining sites all appear quite normal without any abnormality. Other:     ED Results / Procedures / Treatments   Labs (all labs ordered are listed, but only abnormal results are displayed) Labs Reviewed  COMPREHENSIVE METABOLIC PANEL - Abnormal; Notable for the following components:      Result Value   Total Protein 8.3 (*)    AST 67 (*)    ALT 80 (*)    All other components within normal limits  CBC WITH DIFFERENTIAL/PLATELET - Abnormal; Notable for the following components:   WBC 12.6 (*)    Neutro Abs 9.4 (*)    All other components  within normal limits     EKG     RADIOLOGY   Personally interpreted the patient's abdominal x-ray, small amount of free air is present, no obvious obstructive findings in the abdomen pelvis  DG Abd 2 Views  Result Date: 10/09/2022 CLINICAL DATA:  No bowel movement or flatus after surgery. Laparoscopic cholecystectomy 3 days ago. EXAM: ABDOMEN - 2 VIEW COMPARISON:  None Available. FINDINGS: Bowel gas pattern is. Small amount of free air is noted under the hemidiaphragms, consistent with recent laparoscopic surgery. Bibasilar airspace opacities are present, right greater left. IMPRESSION: 1. Small amount of free air under the hemidiaphragms, consistent with recent laparoscopic surgery. 2. Nonobstructive bowel gas pattern. 3. Bibasilar airspace disease likely reflects atelectasis. Infection is not excluded. Electronically Signed   By: Marin Roberts M.D.   On: 10/09/2022 14:28    Discussed x-ray findings with Dr. Aleen Campi who reports he also reviewed the imaging and reports  this is a typical amount of air expected after procedure and he recommends appropriate for discharge and will follow patient up in the clinic on likely Tuesday  PROCEDURES:  Critical Care performed: No  Procedures   MEDICATIONS ORDERED IN ED: Medications  amoxicillin-clavulanate (AUGMENTIN) 875-125 MG per tablet 1 tablet (1 tablet Oral Given 10/09/22 1426)     IMPRESSION / MDM / ASSESSMENT AND PLAN / ED COURSE  I reviewed the triage vital signs and the nursing notes.                              Differential diagnosis includes, but is not limited to, possible scabbing, proteinaceous material, possible very mild amount of pus or surgical site near the periumbilical region, skin infection, surgical site infection, etc.  She does not have any major or concerning abdominal findings she does not have any systemic symptoms or fever.  She is still eating and drinking but does report no passing of flatus yet and feeling somewhat constipated.  Discussed the patient's clinical history regarding her chief complaint and concerns for small amount of purulence as well as her not having had a bowel movement with her surgeon Dr. Aleen Campi.  Dr. Aleen Campi recommends starting her on Augmentin, and advises follow-up in the clinic likely on Tuesday but he will make certain that she has an appointment to see him or one of his associates by minimum on Wednesday of this coming week.  He also advises that if her abdominal x-ray does not show concern or evidence of an ileus that he would recommend trial of magnesium citrate at home and close follow-up at his clinic.    Patient's presentation is most consistent with acute complicated illness / injury requiring diagnostic workup.   Discussed with patient, she is understanding of the plan for follow-up either Monday Tuesday or Wednesday this week.  She will be calling the surgery clinic and knows that they are also to call her on Monday.  Discussed careful return  precautions.  She will also trial magnesium citrate at home to see if this is helpful with treating her constipation.  Will return if fever, swelling, redness, vomiting, increasing severe or concerning pain etc.       FINAL CLINICAL IMPRESSION(S) / ED DIAGNOSES   Final diagnoses:  Superficial incisional surgical site infection  Constipation, unspecified constipation type     Rx / DC Orders   ED Discharge Orders          Ordered  amoxicillin-clavulanate (AUGMENTIN) 875-125 MG tablet  2 times daily        10/09/22 1409             Note:  This document was prepared using Dragon voice recognition software and may include unintentional dictation errors.   Sharyn Creamer, MD 10/09/22 1600

## 2022-10-09 NOTE — ED Triage Notes (Signed)
Pt to ED with friend, Spanish speaking, complains of possible infection to laparoscopic sites to abdomen from gallbladder surgery this past Wednesday. Pt noticed "pus" this morning. No purulent drainage noted by this RN but sites do appear slightly reddened to surrounding skin, especially site below umbilicus. Pt also complains no BM since Wednesday and has been taking laxatives.

## 2022-10-11 ENCOUNTER — Telehealth: Payer: Self-pay

## 2022-10-11 ENCOUNTER — Encounter: Payer: Self-pay | Admitting: Surgery

## 2022-10-11 ENCOUNTER — Encounter: Payer: Self-pay | Admitting: Pulmonary Disease

## 2022-10-11 NOTE — Telephone Encounter (Signed)
The findings in the lungs are the same from baseline. A little atelectasis noted because of recent surgery but nothing of concern.

## 2022-10-11 NOTE — Transitions of Care (Post Inpatient/ED Visit) (Signed)
   10/11/2022  Name: Jeanette Sandoval MRN: [id number] DOB: 01-07-1986  Today's TOC FU Call Status: Today's TOC FU Call Status:: Successful TOC FU Call Competed TOC FU Call Complete Date: 10/11/22  Transition Care Management Follow-up Telephone Call Date of Discharge: 10/09/22 Discharge Facility: [facility]) Type of Discharge: Emergency Department Reason for ED Visit: Other: How have you been since you were released from the hospital?: Same Any questions or concerns?: No  Items Reviewed: Did you receive and understand the discharge instructions provided?: Yes Medications obtained,verified, and reconciled?: Yes (Medications Reviewed) Any new allergies since your discharge?: No Dietary orders reviewed?: No Do you have support at home?: Yes People in Home: spouse  Medications Reviewed Today: Medications Reviewed Today     Reviewed by Lenard Simmer, MD (Physician) on 10/06/22 at 1416  Med List Status: RN Complete   Medication Order Taking? Sig Documenting Provider Last Dose Status Informant  albuterol (VENTOLIN HFA) 108 (90 Base) MCG/ACT inhaler [id number] Yes Inhale 2 puffs into the lungs every 6 (six) hours as needed for wheezing or shortness of breath. Salena Saner, MD Past Week Active   budesonide-formoterol [facility]) 160-4.5 MCG/ACT inhaler [id number] Yes Inhale 2 puffs into the lungs 2 (two) times daily. Salena Saner, MD 10/05/2022 Active   montelukast (SINGULAIR) 10 MG tablet [id number] No TAKE 1 TABLET BY MOUTH EVERY DAY  Patient taking differently: Take 10 mg by mouth at bedtime. TAKE 1 TABLET BY MOUTH EVERY DAY   Salena Saner, MD 10/03/2022 Active   pantoprazole (PROTONIX) 40 MG tablet [id number] Yes Take 1 tablet (40 mg total) by mouth daily. Jerrol Banana, MD 10/05/2022 Active   sertraline (ZOLOFT) 50 MG tablet [id number] Yes Take 1 tablet (50 mg total) by mouth daily. Jerrol Banana, MD 10/05/2022 Active             Home  Care and Equipment/Supplies: Were Home Health Services Ordered?: No Any new equipment or medical supplies ordered?: No  Functional Questionnaire: Do you need assistance with bathing/showering or dressing?: No Do you need assistance with meal preparation?: No Do you need assistance with eating?: No Do you have difficulty maintaining continence: No Do you need assistance with getting out of bed/getting out of a chair/moving?: No Do you have difficulty managing or taking your medications?: No  Follow up appointments reviewed: PCP Follow-up appointment confirmed?: No MD Provider Line Number:[phone] Given: Yes Specialist Hospital Follow-up appointment confirmed?: Yes Date of Specialist follow-up appointment?: 10/21/22 Do you need transportation to your follow-up appointment?: No Do you understand care options if your condition(s) worsen?: Yes-patient verbalized understanding    SIGNATUREKristi Kevan Ny

## 2022-10-12 ENCOUNTER — Ambulatory Visit (INDEPENDENT_AMBULATORY_CARE_PROVIDER_SITE_OTHER): Payer: 59 | Admitting: Physician Assistant

## 2022-10-12 ENCOUNTER — Encounter: Payer: Self-pay | Admitting: Physician Assistant

## 2022-10-12 VITALS — BP 96/66 | HR 98 | Temp 98.0°F | Ht 65.0 in | Wt 142.0 lb

## 2022-10-12 DIAGNOSIS — K8020 Calculus of gallbladder without cholecystitis without obstruction: Secondary | ICD-10-CM

## 2022-10-12 DIAGNOSIS — Z09 Encounter for follow-up examination after completed treatment for conditions other than malignant neoplasm: Secondary | ICD-10-CM

## 2022-10-12 NOTE — Progress Notes (Unsigned)
[facility] POST-OP OFFICE VISIT  10/12/2022  HPI: Jeanette Sandoval is a 37 y.o. female 6 days s/p robotic assisted laparoscopic cholecystectomy for symptomatic cholelithiasis with Dr Aleen Campi  She did have to go to the Ed on 05/18 secondary to concerns over a possible wound infection noting redness and "yellowish" drainage from her umbilical incision. She was given Augmentin x7 days and discharged home with close follow up.   She presents today for follow up. Reports that she still has abdominal soreness as well as left sided rib pain. She denied any fever, chills, nausea, emesis. She has continued her Abx as prescribe. Tolerating PO. No other complaints.   Vital signs: BP 96/66   Pulse 98   Temp 98 F (36.7 C)   Ht 5\' 5"  (1.651 m)   Wt 142 lb (64.4 kg)   LMP 09/15/2022 (Exact Date) Comment: Urine pregnancy Test Negative 10/06/2022  SpO2 98%   BMI 23.63 kg/m    Physical Exam: Constitutional: Well appearing female, NAD Abdomen: Soft, expected soreness over the incisions themselves, also with mild LUQ/rib pain, non-distended, no rebound/guarding. Certainly no evidence of peritonitis Skin: Regarding her umbilical incision, there is a small <5 mm area of superficial skin dehiscence to the left lateral aspect, there is not drainage, no fluctuance, and no erythema. Her remaining laparoscopic sites are healing well; dermabond intact. Across all 4 laparoscopic sites there is healing ecchymosis.   Assessment/Plan: This is a 37 y.o. female 6 days s/p robotic assisted laparoscopic cholecystectomy for symptomatic cholelithiasis with Dr Aleen Campi   - No evidence of worsening or untreated infection at this time; Reasonable to complete course of Abx as precaution  - Pain control prn; OTC medications +/- ice packs  - Reviewed wound care recommendation  - Reviewed lifting restrictions; 4 weeks total  - Reviewed surgical pathology; CCC  - I will see her again in 2-3 weeks prior to a  planned trip; She understands to call with questions/concerns in the interim  -- Lynden Oxford, PA-C [facility] 10/12/2022, 3:44 PM M-F: 7am - 4pm

## 2022-10-12 NOTE — Patient Instructions (Signed)

## 2022-10-19 ENCOUNTER — Encounter: Payer: Self-pay | Admitting: Family Medicine

## 2022-10-19 NOTE — Telephone Encounter (Signed)
Please advise 

## 2022-10-21 ENCOUNTER — Encounter: Payer: 59 | Admitting: Physician Assistant

## 2022-11-02 ENCOUNTER — Encounter: Payer: Self-pay | Admitting: Physician Assistant

## 2022-11-02 ENCOUNTER — Ambulatory Visit: Payer: 59 | Admitting: Family Medicine

## 2022-11-02 ENCOUNTER — Ambulatory Visit (INDEPENDENT_AMBULATORY_CARE_PROVIDER_SITE_OTHER): Payer: 59 | Admitting: Physician Assistant

## 2022-11-02 VITALS — BP 105/70 | HR 72 | Temp 98.0°F | Ht 65.0 in | Wt 141.0 lb

## 2022-11-02 DIAGNOSIS — K8020 Calculus of gallbladder without cholecystitis without obstruction: Secondary | ICD-10-CM

## 2022-11-02 DIAGNOSIS — Z09 Encounter for follow-up examination after completed treatment for conditions other than malignant neoplasm: Secondary | ICD-10-CM

## 2022-11-02 NOTE — Progress Notes (Signed)
[facility] POST-OP OFFICE VISIT  11/02/2022  HPI: Jeanette Sandoval is a 37 y.o. female 27 days s/p robotic assisted laparoscopic cholecystectomy for symptomatic cholelithiasis with Dr Aleen Campi   She is doing better; no longer with left rib pain She did have two episodes of the pain she had prior to gallbladder surgery with nausea - plans to follow up with GI for this No fever, chills, emesis, juandice Incisions are well healed No other complaints   Vital signs: BP 105/70   Pulse 72   Temp 98 F (36.7 C)   Ht 5\' 5"  (1.651 m)   Wt 141 lb (64 kg)   LMP 09/15/2022 (Exact Date) Comment: Urine pregnancy Test Negative 10/06/2022  SpO2 97%   BMI 23.46 kg/m    Physical Exam: Constitutional: Well appearing female, NAD Abdomen: Soft, non-tender, non-distended, no rebound/guarding Skin: Laparoscopic incisions are healing well, no erythema or drainage   Assessment/Plan: This is a 37 y.o. female 27 days s/p robotic assisted laparoscopic cholecystectomy for symptomatic cholelithiasis with Dr Aleen Campi    - Pain control prn  - Reviewed wound care recommendation  - Reviewed lifting restrictions; completes these tomorrow   - She can follow up on as needed basis; She understands to call with questions/concerns  -- Lynden Oxford, PA-C [facility] 11/02/2022, 3:21 PM M-F: 7am - 4pm

## 2022-11-02 NOTE — Patient Instructions (Signed)

## 2022-11-19 ENCOUNTER — Other Ambulatory Visit: Payer: Self-pay | Admitting: Family Medicine

## 2022-11-19 DIAGNOSIS — F419 Anxiety disorder, unspecified: Secondary | ICD-10-CM

## 2022-11-19 DIAGNOSIS — K2100 Gastro-esophageal reflux disease with esophagitis, without bleeding: Secondary | ICD-10-CM

## 2022-11-22 NOTE — Telephone Encounter (Signed)
Requested Prescriptions  Pending Prescriptions Disp Refills   pantoprazole (PROTONIX) 40 MG tablet [Pharmacy Med Name: PANTOPRAZOLE SOD DR 40 MG TAB] 90 tablet 0    Sig: TAKE 1 TABLET BY MOUTH EVERY DAY     Gastroenterology: Proton Pump Inhibitors Passed - 11/19/2022  2:34 AM      Passed - Valid encounter within last 12 months    Recent Outpatient Visits           3 months ago Anxiety and depression   [place] [facility], Ocie Bob, MD   5 months ago Anxiety and depression   [place] [facility], Ocie Bob, MD   8 months ago Annual physical exam   [facility] at [facility], Ocie Bob, MD   1 year ago Malar rash   [facility] at [facility], Ocie Bob, MD       Future Appointments             In 1 month Salena Saner, MD [place] [facility]             sertraline (ZOLOFT) 50 MG tablet [Pharmacy Med Name: SERTRALINE HCL 50 MG TABLET] 90 tablet     Sig: TAKE 1 TABLET BY MOUTH EVERY DAY     Psychiatry:  Antidepressants - SSRI - sertraline Failed - 11/19/2022  2:34 AM      Failed - AST in normal range and within 360 days    AST  Date Value Ref Range Status  10/09/2022 67 (H) 15 - 41 U/L Final         Failed - ALT in normal range and within 360 days    ALT  Date Value Ref Range Status  10/09/2022 80 (H) 0 - 44 U/L Final         Passed - Completed PHQ-2 or PHQ-9 in the last 360 days      Passed - Valid encounter within last 6 months    Recent Outpatient Visits           3 months ago Anxiety and depression   [place] [facility], Ocie Bob, MD   5 months ago Anxiety and depression   [facility] at [facility], Ocie Bob, MD   8 months ago Annual physical exam    [facility] at [facility], Ocie Bob, MD   1 year ago Malar rash   [facility] at [facility], Ocie Bob, MD       Future Appointments             In 1 month Salena Saner, MD [facility] at [facility]

## 2022-11-22 NOTE — Telephone Encounter (Signed)
Requested medication (s) are due for refill today:yes   Requested medication (s) are on the active medication list:yes Last refill:  08/09/22 #90  Future visit scheduled:no Notes to clinic:  liver enzymes out of range   Requested Prescriptions  Pending Prescriptions Disp Refills   sertraline (ZOLOFT) 50 MG tablet [Pharmacy Med Name: SERTRALINE HCL 50 MG TABLET] 90 tablet     Sig: TAKE 1 TABLET BY MOUTH EVERY DAY     Psychiatry:  Antidepressants - SSRI - sertraline Failed - 11/19/2022  2:34 AM      Failed - AST in normal range and within 360 days    AST  Date Value Ref Range Status  10/09/2022 67 (H) 15 - 41 U/L Final         Failed - ALT in normal range and within 360 days    ALT  Date Value Ref Range Status  10/09/2022 80 (H) 0 - 44 U/L Final         Passed - Completed PHQ-2 or PHQ-9 in the last 360 days      Passed - Valid encounter within last 6 months    Recent Outpatient Visits           3 months ago Anxiety and depression   [place] [facility], Ocie Bob, MD   5 months ago Anxiety and depression   [place] [facility], Ocie Bob, MD   8 months ago Annual physical exam   [facility] at [facility], Ocie Bob, MD   1 year ago Malar rash   [facility] at [facility], Ocie Bob, MD       Future Appointments             In 1 month Salena Saner, MD [place] [facility] at [facility]            Signed Prescriptions Disp Refills   pantoprazole (PROTONIX) 40 MG tablet 90 tablet 0    Sig: TAKE 1 TABLET BY MOUTH EVERY DAY     Gastroenterology: Proton Pump Inhibitors Passed - 11/19/2022  2:34 AM      Passed - Valid encounter within last 12 months    Recent Outpatient Visits           3 months ago Anxiety and depression   [place] [facility], Ocie Bob, MD   5 months ago Anxiety and depression   [facility] at [facility], Ocie Bob, MD   8 months ago Annual physical exam   [facility] at [facility], Ocie Bob, MD   1 year ago Malar rash   [facility] at [facility], Ocie Bob, MD       Future Appointments             In 1 month Salena Saner, MD [facility] at [facility]

## 2023-01-03 ENCOUNTER — Ambulatory Visit: Payer: 59 | Admitting: Pulmonary Disease

## 2023-01-03 ENCOUNTER — Encounter: Payer: Self-pay | Admitting: Pulmonary Disease

## 2023-01-03 VITALS — BP 110/60 | HR 83 | Temp 97.8°F | Ht 65.0 in | Wt 139.8 lb

## 2023-01-03 DIAGNOSIS — K219 Gastro-esophageal reflux disease without esophagitis: Secondary | ICD-10-CM | POA: Diagnosis not present

## 2023-01-03 DIAGNOSIS — Z9049 Acquired absence of other specified parts of digestive tract: Secondary | ICD-10-CM

## 2023-01-03 DIAGNOSIS — J4540 Moderate persistent asthma, uncomplicated: Secondary | ICD-10-CM

## 2023-01-03 DIAGNOSIS — J479 Bronchiectasis, uncomplicated: Secondary | ICD-10-CM | POA: Diagnosis not present

## 2023-01-03 MED ORDER — BUDESONIDE-FORMOTEROL FUMARATE 160-4.5 MCG/ACT IN AERO: 2.0000 | INHALATION_SPRAY | Freq: Two times a day (BID) | RESPIRATORY_TRACT | 11 refills | Status: AC

## 2023-01-03 NOTE — Patient Instructions (Addendum)
Your lungs sounded very clear today.  Continue using your Symbicort 2 puffs twice a day.  We sent a renewal prescription to your pharmacy.  Continue doing your therapy vest.  We will she will follow-up in 6 months time call sooner should any new problems arise.    Tus pulmones sonaban muy claros hoy.  Contine usando sus dosis de Symbicort 2 [employer].  Enviamos una receta de renovacin a su farmacia.  Contine haciendo su chaleco de terapia.  Haremos un seguimiento dentro de 6 meses y llamaremos antes si surge algn problema nuevo.

## 2023-01-03 NOTE — Progress Notes (Signed)
Subjective:    Patient ID: Jeanette Sandoval, female    DOB: Dec 18, 1985, 37 y.o.   MRN: [id number]  Patient Care Team: Jerrol Banana, MD as PCP - General (Family Medicine) End, Cristal Deer, MD as PCP - Cardiology (Cardiology) Salena Saner, MD as Consulting Physician (Pulmonary Disease)  Chief Complaint  Patient presents with   Follow-up    No SOB or wheezing. Cough with light green sputum.    HPI Patient is a 37 year old lifelong never smoker, native of [place], who follows here for the issue of bronchiectasis.  This is a scheduled visit.  Last visit with me was 16 September 2022.  Recall that she presented to the emergency room on 7 January with complaint of chest pain. CT angio chest that showed possible mild enlargement of the pulmonary artery.  No PE.  She had the previously noted bronchiectatic changes.  There was some tree-in-bud micro nodularity and groundglass attenuation airspace opacities which were consistent with an acute infectious process.This was due to a bronchiectasis flare.  She eventually was treated with antibiotics.  At her follow-up appointment from that ED visit she continued to complain of chest pain, globus sensation and tachypalpitations.  At that visit we ordered a sputum culture however she was unable to produce the sputum.  Referred to cardiology and an echocardiogram was obtained.  She was also treated with doxycycline for her flare and after careful interview it appear that her chest pain was driven by gastroesophageal reflux and esophageal spasm she was given a trial of Protonix and she notes that this has been immensely helpful.  She initially did very well with the Protonix but was then complaining of having colicky type of pain particularly in the right upper quadrant and across her waist and chest discomfort that is relieved by belching.  She has noted that fatty foods trigger the symptoms.  She underwent ultrasound of the right upper quadrant which showed  that she had extensive cholelithiasis but no secondary signs of acute cholecystitis.  She underwent cholecystectomy on 06 Oct 2022.  She has noted marked improvement on her symptoms.  She has not had any further chest discomfort or no right upper quadrant pain.  She has been in [place] for the last 2 weeks and just returned last night.  She had no symptoms while in [place]. She is on Symbicort 160/4.5, 2 inhalations twice a day for moderate persistent asthma however while she was in [place] she was using this medication only once a day as she did not want to run out of it.  She uses albuterol rarely perhaps once to twice per month.  She is compliant with the medication.   Recall that she has had extensive work-up for her bronchiectasis including bronchoscopy in the past (in [place]).  She has had negative sputum collected here for regular culture and MAI.  Prior negative QuantiFERON gold.  She does note that since she started vest physiotherapy her cough has pretty much resolved and her sputum production is more manageable however, she has had variable compliance with vest physiotherapy because she gets "busy", recently due to her travel to [place] she has not used it in the last 2 weeks. She has not had any recent fevers, chills or sweats since her last visit here.  She has not had shortness of breath.  No other new symptomatology.      Review of Systems A 10 point review of systems was performed and it is as noted above otherwise negative.  Patient Active Problem List   Diagnosis Date Noted   Symptomatic cholelithiasis 10/06/2022   Gastroesophageal reflux disease with esophagitis without hemorrhage 08/23/2022   Other constipation 08/23/2022   Shortness of breath 06/11/2022   Anxiety and depression 06/08/2022   Chest pain of uncertain etiology 06/08/2022   Annual physical exam 03/08/2022   Cervical cancer screening 03/08/2022   Irregular menses 03/08/2022   Malar rash 03/02/2021    Bronchiectasis without complication (HCC) 03/02/2021   Allergic rhinitis 03/02/2021   Breast pain, left 03/02/2021    Social History   Tobacco Use   Smoking status: Never    Passive exposure: Never   Smokeless tobacco: Never  Substance Use Topics   Alcohol use: Yes    Comment: occassional    No Known Allergies  Current Meds  Medication Sig   acetaminophen (TYLENOL) 500 MG tablet Take 2 tablets (1,000 mg total) by mouth every 6 (six) hours as needed for mild pain.   albuterol (VENTOLIN HFA) 108 (90 Base) MCG/ACT inhaler Inhale 2 puffs into the lungs every 6 (six) hours as needed for wheezing or shortness of breath.   budesonide-formoterol (SYMBICORT) 160-4.5 MCG/ACT inhaler Inhale 2 puffs into the lungs 2 (two) times daily.   ibuprofen (ADVIL) 600 MG tablet Take 1 tablet (600 mg total) by mouth every 8 (eight) hours as needed for moderate pain.   montelukast (SINGULAIR) 10 MG tablet TAKE 1 TABLET BY MOUTH EVERY DAY (Patient taking differently: Take 10 mg by mouth at bedtime. TAKE 1 TABLET BY MOUTH EVERY DAY)   pantoprazole (PROTONIX) 40 MG tablet TAKE 1 TABLET BY MOUTH EVERY DAY   sertraline (ZOLOFT) 50 MG tablet TAKE 1 TABLET BY MOUTH EVERY DAY    Immunization History  Administered Date(s) Administered   Influenza-Unspecified 02/25/2021, 03/03/2022   Pfizer Covid-19 Vaccine Bivalent Booster 7yrs & up 06/10/2020        Objective:     BP 110/60 (BP Location: Left Arm, Cuff Size: Normal)   Pulse 83   Temp 97.8 F (36.6 C)   Ht 5\' 5"  (1.651 m)   Wt 139 lb 12.8 oz (63.4 kg)   SpO2 98%   BMI 23.26 kg/m   SpO2: 98 % O2 Device: None (Room air)  GENERAL: Well-developed, well-nourished woman, no acute distress.  Fully ambulatory.  No conversational dyspnea HEAD: Normocephalic, atraumatic.  EYES: Pupils equal, round, reactive to light.  No scleral icterus.  MOUTH: Dentition intact, no mucosal abnormalities. NECK: Supple. No thyromegaly. Trachea midline. No JVD.  No  adenopathy. PULMONARY: Good air entry bilaterally.  No adventitious sounds.   CARDIOVASCULAR: S1 and S2. Regular rate and rhythm.  No rubs, murmurs or gallops heard. ABDOMEN: Benign. MUSCULOSKELETAL: No joint deformity, no clubbing, no edema.  NEUROLOGIC: No overt focal deficit, no gait disturbance, speech is fluent. SKIN: Intact,warm,dry. PSYCH: Mood and behavior normal.   Assessment & Plan:     ICD-10-CM   1. Bronchiectasis without complication (HCC)  J47.9    Appears to be well compensated Reminded to use her vest physiotherapy twice a day    2. Moderate persistent asthma without complication  J45.40    Continue Symbicort Reminded that medications twice a day dosage Continue as needed albuterol    3. Gastroesophageal reflux disease, unspecified whether esophagitis present  K21.9    Markedly improved after cholecystectomy Currently on PPI Observes antireflux measures    4. S/P laparoscopic cholecystectomy  Z90.49    No chest pain since this procedure Reflux symptoms also markedly  improved     Meds ordered this encounter  Medications   budesonide-formoterol (SYMBICORT) 160-4.5 MCG/ACT inhaler    Sig: Inhale 2 puffs into the lungs 2 (two) times daily.    Dispense:  1 each    Refill:  11   We will see the patient in 6 months time she is to contact us prior to that time should any new difficulties arise.   Gailen Shelter, MD [facility]    *This note was dictated using voice recognition software/Dragon.  Despite best efforts to proofread, errors can occur which can change the meaning. Any transcriptional errors that result from this process are unintentional and may not be fully corrected at the time of dictation.

## 2023-01-11 ENCOUNTER — Ambulatory Visit: Payer: 59 | Admitting: Gastroenterology

## 2023-01-11 ENCOUNTER — Encounter: Payer: Self-pay | Admitting: Gastroenterology

## 2023-01-11 VITALS — BP 102/61 | HR 78 | Temp 97.8°F | Ht 65.0 in | Wt 139.0 lb

## 2023-01-11 DIAGNOSIS — Z800 Family history of malignant neoplasm of digestive organs: Secondary | ICD-10-CM

## 2023-01-11 DIAGNOSIS — Z8719 Personal history of other diseases of the digestive system: Secondary | ICD-10-CM

## 2023-01-11 DIAGNOSIS — K5909 Other constipation: Secondary | ICD-10-CM

## 2023-01-11 NOTE — Progress Notes (Signed)
Jeanette Repress, MD [address]  [address]  [place], [place] [address]  Main: [phone]  Fax: [phone]    Gastroenterology Consultation  Referring Provider:     Jerrol Banana, MD Primary Care Physician:  Jeanette Banana, MD Primary Gastroenterologist:  Dr. Arlyss Sandoval Reason for Consultation: Irregular bowel habits, chronic GERD        HPI:   Jeanette Sandoval is a 37 y.o. female referred by Dr. Ashley Sandoval, Jeanette Bob, MD  for consultation & management of history of GERD.  Patient reports that she has been having symptoms of heartburn, epigastric pain since January, has been taking Protonix 40 mg daily.  She underwent workup for right upper quadrant pain, found to have cholelithiasis, underwent laparoscopic cholecystectomy in 09/2022.  Since then, the frequency of episodes have improved but have not resolved.  She continued to take Protonix 40 mg daily, managing it as acid reflux, made significant changes in her eating habits as well as GERD lifestyle.  She is able to be off Protonix for last 4 weeks and denies having any upper GI symptoms.  She also reports having irregular bowel movements since her childhood, about once or twice weekly and thought it was normal.  Currently, she has been having bowel movement every other day without any straining, denies any hard stools, takes MiraLAX at night.  Has incorporated natural fiber in her diet.  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, moved to [place] about 3 years ago Labs reveal normal hemoglobin Does not smoke, occasional alcohol use, has a 35-year-old child  NSAIDs: None  Antiplts/Anticoagulants/Anti thrombotics: None  GI Procedures: None  Past Medical History:  Diagnosis Date   Asthma    Bronchiectasis (HCC)    GERD (gastroesophageal reflux disease)     Past Surgical History:   Procedure Laterality Date   APPENDECTOMY  1999   CESAREAN SECTION  2019   CHOLECYSTECTOMY  09/2013   NASAL SINUS SURGERY  2014   WISDOM TOOTH EXTRACTION Bilateral 11/2020     Current Outpatient Medications:    albuterol (VENTOLIN HFA) 108 (90 Base) MCG/ACT inhaler, Inhale 2 puffs into the lungs every 6 (six) hours as needed for wheezing or shortness of breath., Disp: 8 g, Rfl: 2   budesonide-formoterol (SYMBICORT) 160-4.5 MCG/ACT inhaler, Inhale 2 puffs into the lungs 2 (two) times daily., Disp: 1 each, Rfl: 11   montelukast (SINGULAIR) 10 MG tablet, TAKE 1 TABLET BY MOUTH EVERY DAY (Patient taking differently: Take 10 mg by mouth at bedtime. TAKE 1 TABLET BY MOUTH EVERY DAY), Disp: 30 tablet, Rfl: 11   sertraline (ZOLOFT) 50 MG tablet, TAKE 1 TABLET BY MOUTH EVERY DAY, Disp: 30 tablet, Rfl: 0   pantoprazole (PROTONIX) 40 MG tablet, TAKE 1 TABLET BY MOUTH EVERY DAY (Patient not taking: Reported on 01/11/2023), Disp: 90 tablet, Rfl: 0   Family History  Problem Relation Age of Onset   Lupus Mother    Hypertension Father    Coronary artery disease Maternal Grandmother    Peripheral Artery Disease Maternal Grandmother    Liver cancer Paternal Grandmother    Stomach cancer Maternal Aunt    Ovarian cancer Maternal Aunt    Breast cancer Neg Hx      Social History   Tobacco Use   Smoking status: Never    Passive exposure: Never  Smokeless tobacco: Never  Vaping Use   Vaping status: Never Used  Substance Use Topics   Alcohol use: Yes    Comment: occassional   Drug use: Never    Allergies as of 01/11/2023   (No Known Allergies)    Review of Systems:    All systems reviewed and negative except where noted in HPI.   Physical Exam:  BP 102/61 (BP Location: Left Arm, Patient Position: Sitting, Cuff Size: Normal)   Pulse 78   Temp 97.8 F (36.6 C) (Oral)   Ht 5\' 5"  (1.651 m)   Wt 139 lb (63 kg)   BMI 23.13 kg/m  No LMP recorded.  General:   Alert,  Well-developed,  well-nourished, pleasant and cooperative in NAD Head:  Normocephalic and atraumatic. Eyes:  Sclera clear, no icterus.   Conjunctiva pink. Ears:  Normal auditory acuity. Nose:  No deformity, discharge, or lesions. Mouth:  No deformity or lesions,oropharynx pink & moist. Neck:  Supple; no masses or thyromegaly. Lungs:  Respirations even and unlabored.  Clear throughout to auscultation.   No wheezes, crackles, or rhonchi. No acute distress. Heart:  Regular rate and rhythm; no murmurs, clicks, rubs, or gallops. Abdomen:  Normal bowel sounds. Soft, non-tender and non-distended without masses, hepatosplenomegaly or hernias noted.  No guarding or rebound tenderness.   Rectal: Not performed Msk:  Symmetrical without gross deformities. Good, equal movement & strength bilaterally. Pulses:  Normal pulses noted. Extremities:  No clubbing or edema.  No cyanosis. Neurologic:  Alert and oriented x3;  grossly normal neurologically. Skin:  Intact without significant lesions or rashes. No jaundice. Psych:  Alert and cooperative. Normal mood and affect.  Imaging Studies: Reviewed  Assessment and Plan:   Jeanette Sandoval is a 37 y.o. Spanish-speaking female from [place] with history of chronic GERD, chronic constipation, status post laparoscopic cholecystectomy for symptomatic cholelithiasis in 09/2022 is seen in consultation for management of GERD and constipation  Chronic GERD Currently symptom-free with antireflux lifestyle Off PPI for last 4 weeks No longer experiencing nocturnal GERD symptoms Reassured patient and continue healthy lifestyle Do not recommend upper endoscopy at this time unless her symptoms recur and not controlled on acid suppressive therapy  Family history of gastric cancer in second-degree relative Recommend H. pylori breath test  Chronic constipation Without any rectal bleeding, no evidence of anemia, normal TSH Reiterated on high-fiber diet, continue MiraLAX, adequate intake  of water and fiber supplements   Follow up as needed, contact via MyChart as needed   Jeanette Repress, MD

## 2023-01-13 LAB — H. PYLORI BREATH TEST: H pylori Breath Test: POSITIVE — AB

## 2023-01-14 ENCOUNTER — Encounter: Payer: Self-pay | Admitting: Gastroenterology

## 2023-01-17 MED ORDER — OMEPRAZOLE 20 MG PO CPDR: 20.0000 mg | DELAYED_RELEASE_CAPSULE | Freq: Two times a day (BID) | ORAL | 0 refills | Status: DC

## 2023-01-17 MED ORDER — CLARITHROMYCIN 500 MG PO TABS: 500.0000 mg | ORAL_TABLET | Freq: Two times a day (BID) | ORAL | 0 refills | Status: AC

## 2023-01-17 MED ORDER — AMOXICILLIN 500 MG PO TABS: 1000.0000 mg | ORAL_TABLET | Freq: Two times a day (BID) | ORAL | 0 refills | Status: AC

## 2023-01-17 NOTE — Addendum Note (Signed)
Addended by: Radene Knee L on: 01/17/2023 10:28 AM   Modules accepted: Orders

## 2023-01-17 NOTE — Telephone Encounter (Signed)
Sent medication to the pharmacy. [facility] [address]. Called patient twice and it said that number can not be completed at this time. Sent patient a Clinical cytogeneticist message with results

## 2023-02-13 ENCOUNTER — Other Ambulatory Visit: Payer: Self-pay | Admitting: Pulmonary Disease

## 2023-02-14 NOTE — Telephone Encounter (Signed)
Dr. Jayme Cloud, please advise if okay to refill Singulair?

## 2023-04-21 IMAGING — CT CT CHEST HIGH RESOLUTION
2 of 7 series · 14 of 36 positions shown, 17 images · non-contrast
Comparison: None.

CLINICAL DATA: Interstitial lung disease, bronchiectasis, shortness
of breath, productive cough

EXAM:
CT CHEST WITHOUT CONTRAST
TECHNIQUE: Multidetector CT imaging of the chest was performed following the
standard protocol without intravenous contrast. High resolution
imaging of the lungs, as well as inspiratory and expiratory imaging,
was performed.

[Series 4: thorax 2.00 cor · coronal · 0.59mm/px · 3 of 160 slices shown]
[im 32/160  lung]
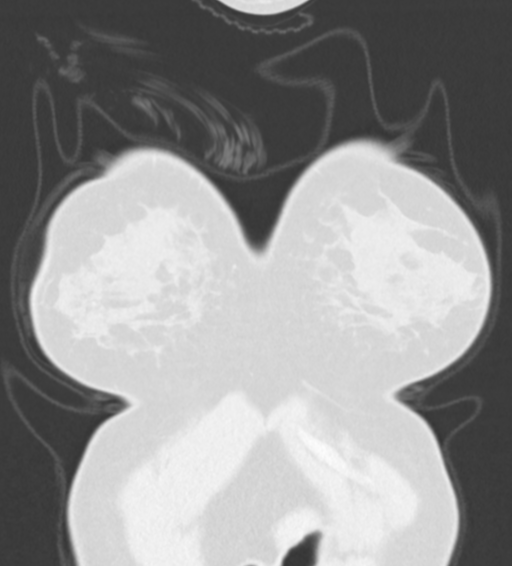
[im 64/160  lung]
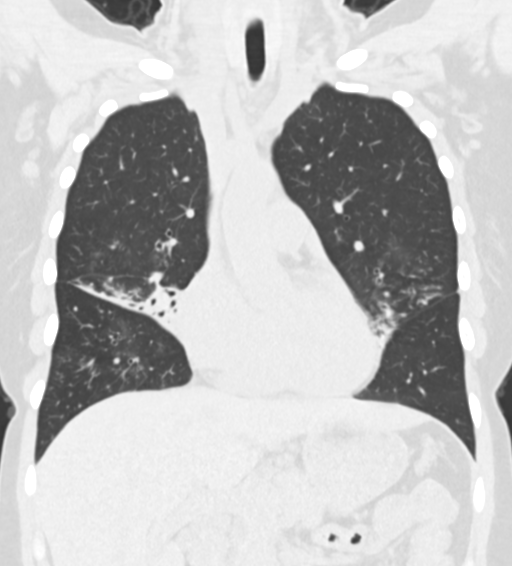
[im 96/160  lung]
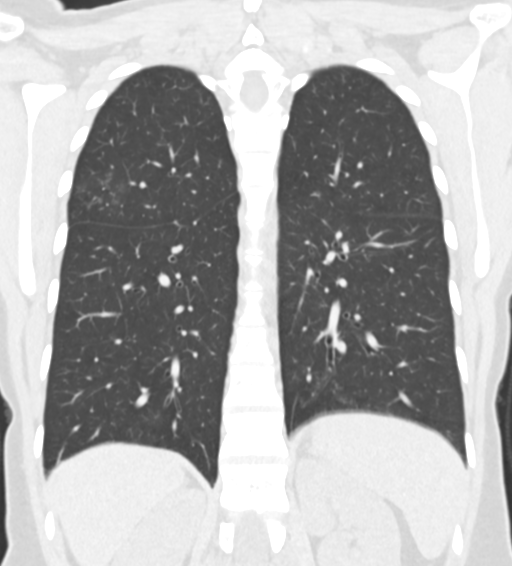

[Series 10: high res (id) thorax 1.00 ax · axial · 0.59mm/px · z∈[-1183,-900]mm · 11 of 335 slices shown, 14 images]
[im 26/335  mediastinal]
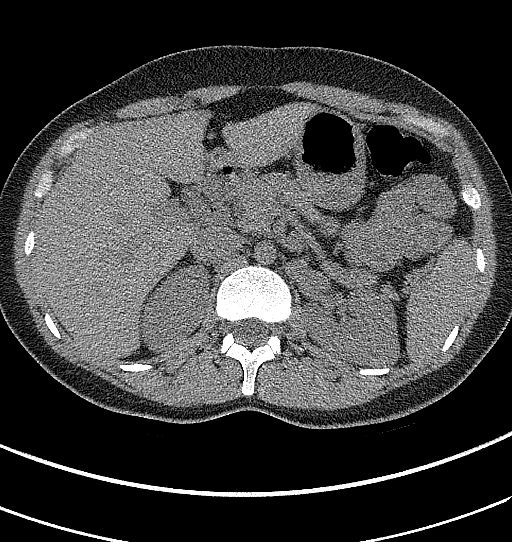
[im 26/335  lung]
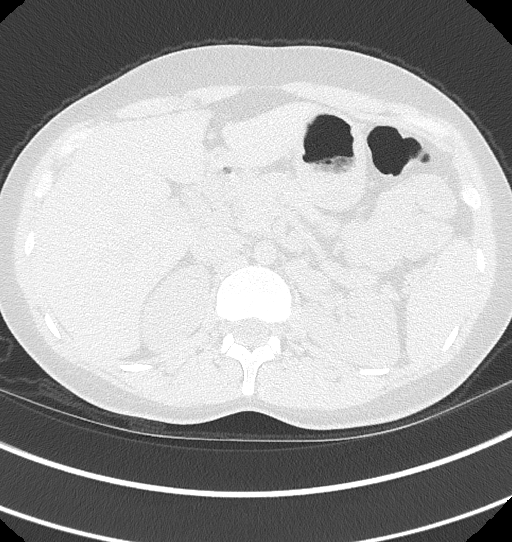
[im 52/335  lung]
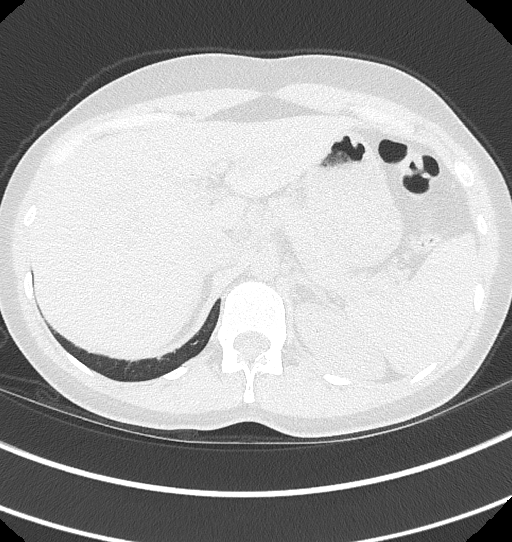
[im 78/335  lung]
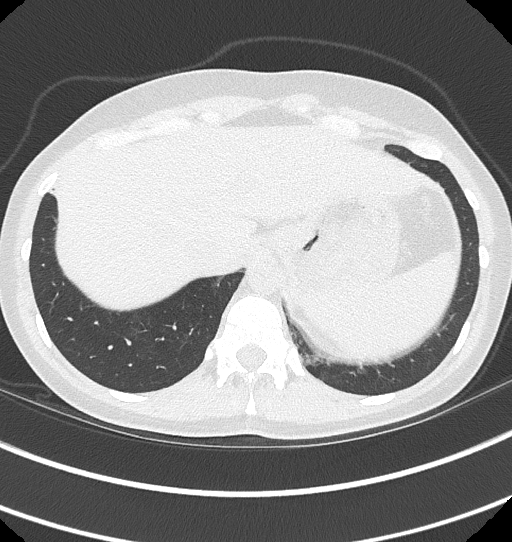
[im 103/335  lung]
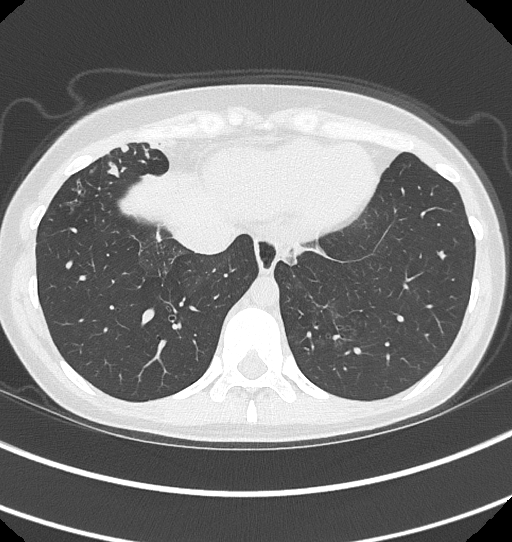
[im 129/335  mediastinal]
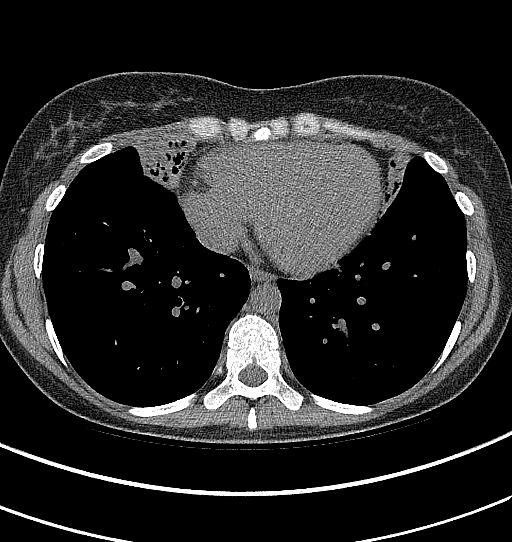
[im 129/335  lung]
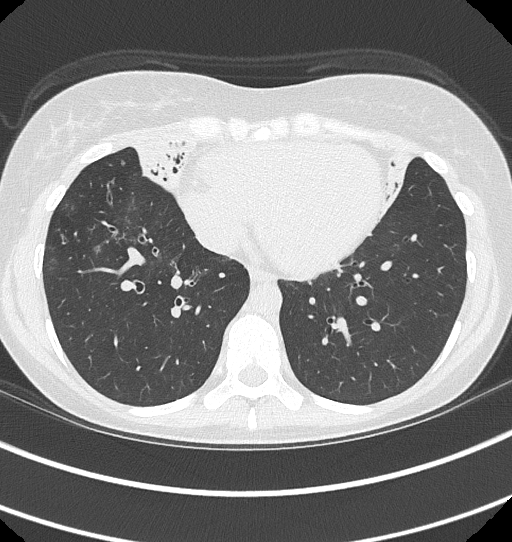
[im 180/335  lung]
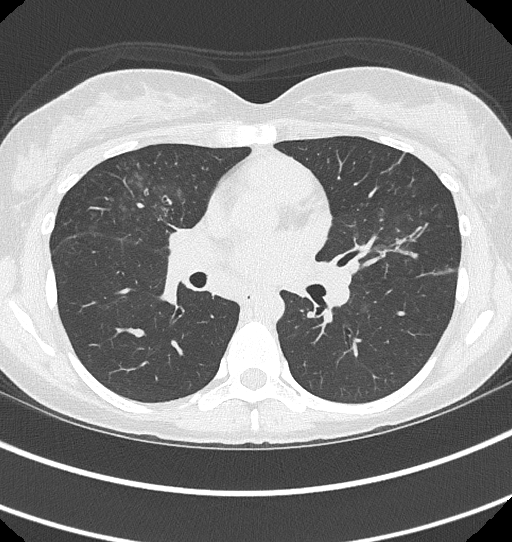
[im 206/335  lung]
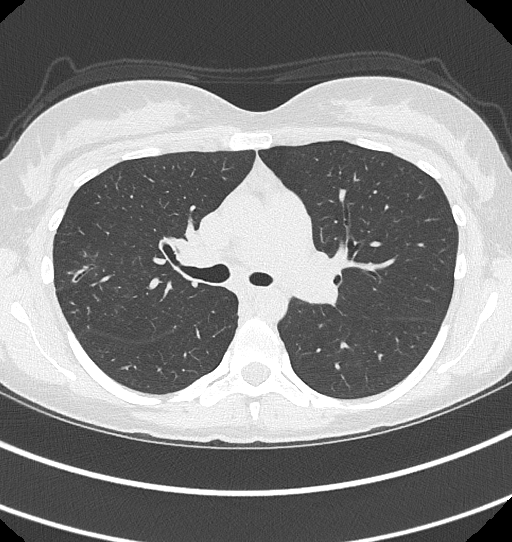
[im 232/335  lung]
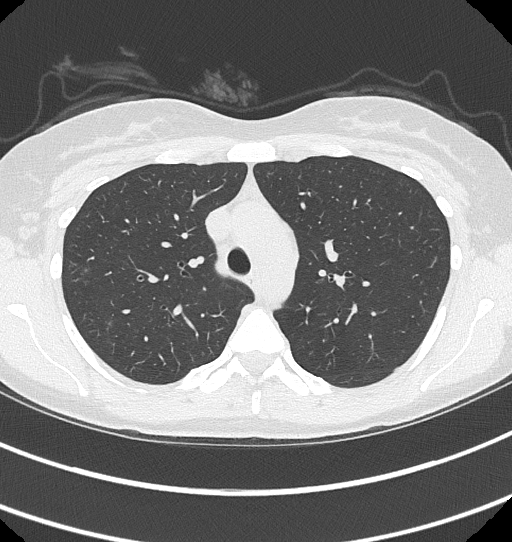
[im 257/335  mediastinal]
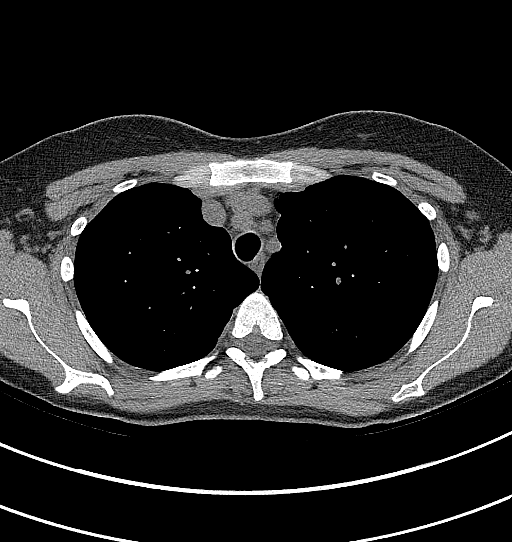
[im 257/335  lung]
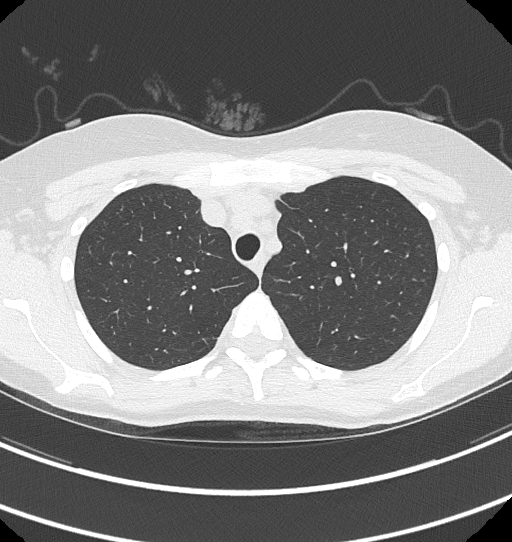
[im 283/335  lung]
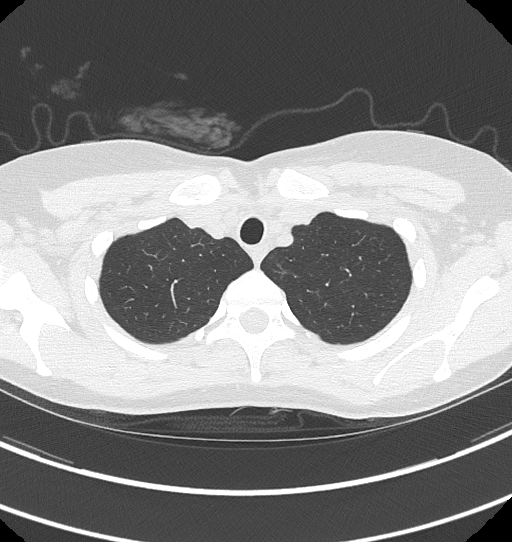
[im 309/335  lung]
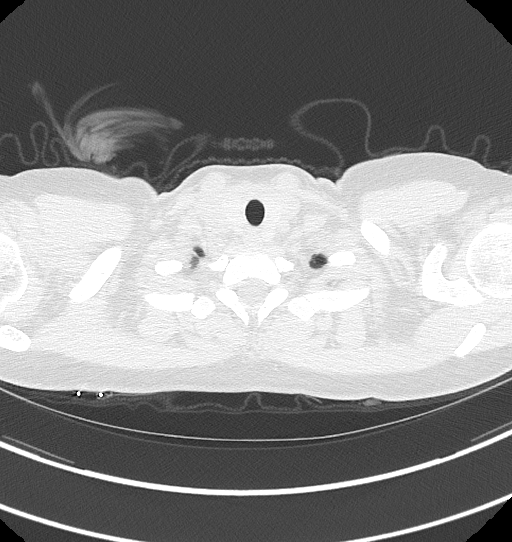

[14 of 36 positions shown; findings below may reference images not displayed]

FINDINGS: Cardiovascular: No significant vascular findings. Normal heart size.
No pericardial effusion.

Mediastinum/Nodes: No enlarged mediastinal, hilar, or axillary lymph
nodes. Thymic remnant in the anterior mediastinum. Thyroid gland,
trachea, and esophagus demonstrate no significant findings.

Lungs/Pleura: Mild, diffuse bilateral bronchial wall thickening.
Severe consolidation, bronchiectasis, and fibrosis of the medial
segment right middle lobe and lingula, with smaller areas of
bronchiectasis, centrilobular nodularity, and ground-glass of the
inferior right upper lobe (series 10, image 177), peripheral right
upper lobe (series 10, image 127) and anterior right lower lobe
(series 10, image 216). Segmental air trapping on expiratory phase
imaging. No pleural effusion or pneumothorax.

Upper Abdomen: No acute abnormality.

Musculoskeletal: No chest wall mass or suspicious bone lesions
identified.
IMPRESSION: 1. Severe consolidation, bronchiectasis, and fibrosis of the medial
segment right middle lobe and lingula, with smaller areas of
bronchiectasis, centrilobular nodularity, and ground-glass of the
inferior right upper lobe, peripheral right upper lobe and anterior
right lower lobe. Findings are consistent with atypical
mycobacterial infection and related chronic fibrotic sequelae.
2. Mild, diffuse bilateral bronchial wall thickening, consistent
with nonspecific infectious or inflammatory bronchitis.
3. Segmental air trapping on expiratory phase imaging, suggestive of
small airways disease or obliterative bronchiolitis.

## 2023-06-21 ENCOUNTER — Encounter: Payer: Self-pay | Admitting: Family Medicine

## 2023-06-21 ENCOUNTER — Other Ambulatory Visit (HOSPITAL_COMMUNITY)
Admission: RE | Admit: 2023-06-21 | Discharge: 2023-06-21 | Disposition: A | Discharge status: Home / Self Care | Payer: 59 | Source: Ambulatory Visit | Attending: Family Medicine | Admitting: Family Medicine

## 2023-06-21 ENCOUNTER — Ambulatory Visit: Payer: 59 | Admitting: Family Medicine

## 2023-06-21 VITALS — BP 110/62 | HR 72 | Ht 65.0 in | Wt 143.2 lb

## 2023-06-21 DIAGNOSIS — B353 Tinea pedis: Secondary | ICD-10-CM | POA: Diagnosis not present

## 2023-06-21 DIAGNOSIS — R87810 Cervical high risk human papillomavirus (HPV) DNA test positive: Secondary | ICD-10-CM | POA: Diagnosis not present

## 2023-06-21 DIAGNOSIS — Z23 Encounter for immunization: Secondary | ICD-10-CM

## 2023-06-21 DIAGNOSIS — R300 Dysuria: Secondary | ICD-10-CM | POA: Insufficient documentation

## 2023-06-21 DIAGNOSIS — R8290 Unspecified abnormal findings in urine: Secondary | ICD-10-CM | POA: Insufficient documentation

## 2023-06-21 LAB — POCT URINALYSIS DIPSTICK
Bilirubin, UA: NEGATIVE
Glucose, UA: NEGATIVE
Nitrite, UA: NEGATIVE
Protein, UA: POSITIVE — AB
Spec Grav, UA: 1.015 (ref 1.010–1.025)
Urobilinogen, UA: 0.2 U/dL
pH, UA: 5 (ref 5.0–8.0)

## 2023-06-21 NOTE — Progress Notes (Unsigned)
New Patient Office Visit  Subjective    Patient ID: Jeanette Sandoval, female    DOB: May 10, 1986  Age: 38 y.o. MRN: [id number]  CC:  Chief Complaint  Patient presents with   Urinary Tract Infection    UTI symptoms for 1 month.     HPI Ara Grandmaison presents to establish care ***  Outpatient Encounter Medications as of 06/21/2023  Medication Sig   albuterol (VENTOLIN HFA) 108 (90 Base) MCG/ACT inhaler Inhale 2 puffs into the lungs every 6 (six) hours as needed for wheezing or shortness of breath.   budesonide-formoterol (SYMBICORT) 160-4.5 MCG/ACT inhaler Inhale 2 puffs into the lungs 2 (two) times daily.   montelukast (SINGULAIR) 10 MG tablet TAKE 1 TABLET BY MOUTH EVERY DAY (Patient not taking: Reported on 06/21/2023)   omeprazole (PRILOSEC) 20 MG capsule Take 1 capsule (20 mg total) by mouth 2 (two) times daily before a meal for 14 days.   sertraline (ZOLOFT) 50 MG tablet TAKE 1 TABLET BY MOUTH EVERY DAY (Patient not taking: Reported on 06/21/2023)   No facility-administered encounter medications on file as of 06/21/2023.    Past Medical History:  Diagnosis Date   Asthma    Bronchiectasis (HCC)    GERD (gastroesophageal reflux disease)     Past Surgical History:  Procedure Laterality Date   APPENDECTOMY  1999   CESAREAN SECTION  2019   CHOLECYSTECTOMY  09/2013   NASAL SINUS SURGERY  2014   WISDOM TOOTH EXTRACTION Bilateral 11/2020    Family History  Problem Relation Age of Onset   Lupus Mother    Hypertension Father    Coronary artery disease Maternal Grandmother    Peripheral Artery Disease Maternal Grandmother    Liver cancer Paternal Grandmother    Stomach cancer Maternal Aunt    Ovarian cancer Maternal Aunt    Breast cancer Neg Hx     Social History   Socioeconomic History   Marital status: Married    Spouse name: Lorriane Shire   Number of children: 1   Years of education: 16   Highest education level: Bachelor's degree (e.g., BA, AB, BS)  Occupational  History   Occupation: Facilities manager  Tobacco Use   Smoking status: Never    Passive exposure: Never   Smokeless tobacco: Never  Vaping Use   Vaping status: Never Used  Substance and Sexual Activity   Alcohol use: Yes    Comment: occassional   Drug use: Never   Sexual activity: Yes    Birth control/protection: Condom  Other Topics Concern   Not on file  Social History Narrative   Not on file   Social Drivers of Health   Financial Resource Strain: Not on file  Food Insecurity: No Food Insecurity (03/08/2022)   Hunger Vital Sign    Worried About Running Out of Food in the Last Year: Never true    Ran Out of Food in the Last Year: Never true  Transportation Needs: No Transportation Needs (03/08/2022)   PRAPARE - Administrator, Civil Service (Medical): No    Lack of Transportation (Non-Medical): No  Physical Activity: Not on file  Stress: Not on file  Social Connections: Not on file  Intimate Partner Violence: Not At Risk (03/08/2022)   Humiliation, Afraid, Rape, and Kick questionnaire    Fear of Current or Ex-Partner: No    Emotionally Abused: No    Physically Abused: No    Sexually Abused: No    ROS  Objective    BP 110/62   Pulse 72   Ht 5\' 5"  (1.651 m)   Wt 143 lb 3.2 oz (65 kg)   SpO2 96%   BMI 23.83 kg/m   Physical Exam  {Labs (Optional):23779}    Assessment & Plan:   Problem List Items Addressed This Visit   None Visit Diagnoses       Foul smelling urine    -  Primary       No follow-ups on file.   Guido Sander, CMA

## 2023-06-21 NOTE — Assessment & Plan Note (Signed)
>>  ASSESSMENT AND PLAN FOR URINE MALODOR WRITTEN ON 06/21/2023  7:32 PM BY Richardine Peppers J, MD  She has been experiencing a change in the smell of her urine for about a month, along with urgency to urinate, needing to go again shortly after urinating, and sometimes feeling the need to urinate but taking a while to do so.   A urine test showed a small amount of leukocytes.  Urinary Symptoms Reports urgency and frequency for a month. Urinalysis showed leukocytes. History of bacterial vaginosis (BV) treated in June. -Perform self-swab test for BV. -If positive, electronically prescribe metronidazole. -If symptoms persist and tests are negative, refer to gynecology for further evaluation.

## 2023-06-21 NOTE — Assessment & Plan Note (Signed)
She has been experiencing a change in the smell of her urine for about a month, along with urgency to urinate, needing to go again shortly after urinating, and sometimes feeling the need to urinate but taking a while to do so.   A urine test showed a small amount of leukocytes.  Urinary Symptoms Reports urgency and frequency for a month. Urinalysis showed leukocytes. History of bacterial vaginosis (BV) treated in June. -Perform self-swab test for BV. -If positive, electronically prescribe metronidazole. -If symptoms persist and tests are negative, refer to gynecology for further evaluation.

## 2023-06-21 NOTE — Assessment & Plan Note (Signed)
She has a history of HPV, detected in June during a visit to [place]. A pap showed the presence of the virus but no other cytological abnormalities. The strain is high risk, and she was advised to repeat testing in six months.  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 -Refer to gynecology for routine cervical cancer screening and further management.

## 2023-06-22 DIAGNOSIS — B353 Tinea pedis: Secondary | ICD-10-CM | POA: Insufficient documentation

## 2023-06-22 MED ORDER — CLOTRIMAZOLE-BETAMETHASONE 1-0.05 % EX CREA: 1.0000 | TOPICAL_CREAM | Freq: Two times a day (BID) | CUTANEOUS | 0 refills | Status: DC

## 2023-06-22 NOTE — Patient Instructions (Signed)
1. Urinary Symptoms:    - Perform self-swab test for bacterial vaginosis.    - If positive, prescribe metronidazole.    - If symptoms persist and tests are negative, refer to gynecology.  2. HPV:    - Refer to gynecology for cervical cancer screening and management.  3. Tinea Pedis (Athlete's Foot):    - Prescribe antifungal powder.    - Reevaluate at next physical if no improvement.  4. Sleep Disturbance:    - Provide sleep hygiene information via MyChart.    - Consider medication if non-medication methods fail.  5. General Health Maintenance:    - Schedule annual physical exam in a couple of months.  Sleep hygiene advice  When possible, maximize regularity in activity and sleep schedule   Regularity in the timing of sleep, food intake, and social activity helps to stabilize the biological clock.Minimizing discrepancies in sleep timing between on-shift and off-shift periods may help you adapt to a fixed-shift schedule and may also help you adapt to each shift type in a rotating-shift schedule (depending onrotation speed and direction).   Create a sleep-friendly bedroom environment   Make sure that your bed is comfortable and that your bedroom is dark, quiet, and cool (around 53F or 18C).Blackout shades may be particularly important to block sunlight during daytime sleep. Creating constantbackground noise in the sleep environment with a fan or humidifier, for example, will eliminate unexpectedsounds that would otherwise wake you up.   Limit exposure to bright light before daytime sleep   Exposure to bright light (eg, sunlight during the morning commute home following a night shift) can bealerting and may also set your biological clock to a time that interferes with daytime sleep.   Make the last hour before bed a "wind-down" time   Engage in relaxing and pleasant activities, dim or block light in the room, and have a light snack.   Do not use alcohol to help you sleep and do  not consume alcohol too close to bedtime   Although alcohol may help you to fall asleep more easily, it disrupts your sleep during the night by causingfrequent awakenings. One drink of alcohol should not be consumed within three hours of bedtime.   Smoking and other drugs will disrupt your sleep   If you smoke, do not smoke too close to bedtime or if you wake up during the intended sleep period. Mostdrugs of abuse can disrupt sleep.   Avoid caffeinated products within six hours of bedtime   In addition to coffee, these may include tea, chocolate, and many sodas.   Exercise regularly, but avoid activities that raise body temperature close to bedtime   Regular exercise can improve sleep quality, but exercising or having a warm bath too close to bedtime candisrupt your ability to fall asleep. Warm baths should be avoided within 1.5 hours of bedtime.   Avoid consuming more than 8 to 10 ounces of liquids close to bedtime   A full or semi-full bladder can contribute to awakenings. Restrict liquids close to bedtime and empty yourbladder just before going to bed.

## 2023-06-22 NOTE — Assessment & Plan Note (Signed)
She reports her feet peeling and has tried various creams without success. The peeling occurs everywhere on her feet.  Tinea Pedis (Athlete's Foot) Reports peeling skin on feet. -Prescribe antifungal Rx. -Reevaluate at next physical if not improved.

## 2023-06-23 LAB — CERVICOVAGINAL ANCILLARY ONLY
Bacterial Vaginitis (gardnerella): NEGATIVE
Candida Glabrata: NEGATIVE
Candida Vaginitis: NEGATIVE
Chlamydia: NEGATIVE
Comment: NEGATIVE
Comment: NEGATIVE
Comment: NEGATIVE
Comment: NEGATIVE
Comment: NEGATIVE
Comment: NORMAL
Neisseria Gonorrhea: NEGATIVE
Trichomonas: NEGATIVE

## 2023-06-24 ENCOUNTER — Encounter: Payer: Self-pay | Admitting: Family Medicine

## 2023-06-24 ENCOUNTER — Other Ambulatory Visit: Payer: Self-pay | Admitting: Family Medicine

## 2023-06-24 LAB — URINE CULTURE

## 2023-06-24 MED ORDER — NITROFURANTOIN MONOHYD MACRO 100 MG PO CAPS: 100.0000 mg | ORAL_CAPSULE | Freq: Two times a day (BID) | ORAL | 0 refills | Status: DC

## 2023-07-21 ENCOUNTER — Ambulatory Visit: Payer: 59 | Admitting: Licensed Practical Nurse

## 2023-07-21 ENCOUNTER — Other Ambulatory Visit (HOSPITAL_COMMUNITY)
Admission: RE | Admit: 2023-07-21 | Discharge: 2023-07-21 | Disposition: A | Discharge status: Home / Self Care | Source: Ambulatory Visit | Attending: Licensed Practical Nurse | Admitting: Licensed Practical Nurse

## 2023-07-21 VITALS — BP 105/67 | HR 76 | Ht 65.0 in | Wt 142.3 lb

## 2023-07-21 DIAGNOSIS — Z124 Encounter for screening for malignant neoplasm of cervix: Secondary | ICD-10-CM | POA: Diagnosis present

## 2023-07-21 DIAGNOSIS — Z01419 Encounter for gynecological examination (general) (routine) without abnormal findings: Secondary | ICD-10-CM | POA: Diagnosis not present

## 2023-07-21 DIAGNOSIS — Z113 Encounter for screening for infections with a predominantly sexual mode of transmission: Secondary | ICD-10-CM

## 2023-07-21 NOTE — Progress Notes (Signed)
 Outpatient Gynecology Note: Annual Visit  Assessment/Plan:    Jeanette Sandoval is a 38 y.o. female G0P0000 with normal well-woman gynecologic exam.   -Need for repeat pap  Collected today   -vaginal discharge/odor   -Aptima swab collected  -No obvious communication between vagina and rectum, it is possible she experiences leakage form her rectum, rec keeping a diary of foods and symptoms.   -in ability to have orgasm   -Recommend sex therapist  -Contraception  -Reviewed all options please call and schedule an appointment for IUD insertion or consult for tubal ligation.  -Wellness labs done by PCP     Risk factors identified in Subjective to review: sexually active with 1 female partner, currently on a break  Denies tobacco/nicotine, occasional alcohol denies illicit drug use  Orders Placed This Encounter  Procedures   HEP, RPR, HIV Panel   Hepatitis C antibody   Current Outpatient Medications  Medication Instructions   albuterol (VENTOLIN HFA) 108 (90 Base) MCG/ACT inhaler 2 puffs, Inhalation, Every 6 hours PRN   budesonide-formoterol (SYMBICORT) 160-4.5 MCG/ACT inhaler 2 puffs, Inhalation, 2 times daily   clotrimazole-betamethasone (LOTRISONE) cream 1 Application, Topical, 2 times daily, X 2 weeks   montelukast (SINGULAIR) 10 MG tablet TAKE 1 TABLET BY MOUTH EVERY DAY   nitrofurantoin (macrocrystal-monohydrate) (MACROBID) 100 mg, Oral, 2 times daily   omeprazole (PRILOSEC) 20 mg, Oral, 2 times daily before meals    No follow-ups on file.    Subjective:    Jeanette Sandoval is a 38 y.o. female G0P0000 who presents for annual wellness visit.   Occupation Teaches second grade    Lives with her son and roommate  Wears glasses, last eye exam July 2024 Dental up to date   Has struggled with anxiety, Yoga helps   CONCERNS?  -Need for repeat pap  Had pap done in [place] in July, it was HPV positive, her provider told her to repeat it in 6 months  -vaginal  discharge/odor   Has had a brown discharge, it has happened more than once. Is concerned that it is stool coming through her vagina as it has a foul odor. Also, she has been wearing panty liners, she noticed when she eats chia seeds, there are chia seeds on her panty liner. She states she has always has an odor, especially when she gets sweaty. She had a c-section for what sounds like a failed induction-she never dilated. Denies any past trauma her her pelvis/rectum. Has a BM every few days which are not difficult to pass. She was recently treated for a UTI, the culture showed e coli. She currently is not sexually active, last had IC 2 months ago with a female partner, they have been on and off and currently are on a "break".   -in ability to have orgasm   -has tried to masturbate, is able to get aroused but no orgasm   -Contraception  -Currently using condoms, used pills and depo in the past, did not like how they made her feel   -Does not desire a future pregnancy, would be interested in a tubal ligation.   Well Woman Visit:  GYN HISTORY:  Patient's last menstrual period was 06/17/2023 (approximate).     Menstrual History: OB History     Gravida  0   Para  0   Term  0   Preterm  0   AB  0   Living  0      SAB  0  IAB  0   Ectopic  0   Multiple  0   Live Births  0           Menarche age:  Patient's last menstrual period was 06/17/2023 (approximate).     Periods are irregular, occur every 1-3 months, last 7 days, on the heavier side x 2-03 days (changes her products 4 times a day), has occasional cramping does not pass clots. .  Intermenstrual bleeding, spotting, or discharge? No  Urinary incontinence? Sometimes   Sexually active: not currently, last IC 2 months ago  Number of sexual partners: 1 Gender of sexual Partners: male Dyspareunia? no Last pap: Was done in [place] this past summer, HPV positive    History of abnormal Pap: HPV positive  Gardasil  series:  not reviewed  STI history: not reviewed  STI/HIV testing or immunizations needed? Yes.    Contraceptive methods: condoms  Health Maintenance > Reviewed breast self-awareness > History of abnormal mammogram: No > Exercise: none, not active > Body mass index is 23.68 kg/m.  > Recent dental visit Yes.   > Seat Belt Use: Yes.   > Texting and driving? No. > Concern for alcohol abuse? No    Tobacco or other drug use: denied. Tobacco Use: Low Risk  (06/21/2023)   Patient History    Smoking Tobacco Use: Never    Smokeless Tobacco Use: Never    Passive Exposure: Never      _________________________________________________________  Current Outpatient Medications  Medication Sig Dispense Refill   albuterol (VENTOLIN HFA) 108 (90 Base) MCG/ACT inhaler Inhale 2 puffs into the lungs every 6 (six) hours as needed for wheezing or shortness of breath. 8 g 2   budesonide-formoterol (SYMBICORT) 160-4.5 MCG/ACT inhaler Inhale 2 puffs into the lungs 2 (two) times daily. 1 each 11   clotrimazole-betamethasone (LOTRISONE) cream Apply 1 Application topically 2 (two) times daily. X 2 weeks 45 g 0   montelukast (SINGULAIR) 10 MG tablet TAKE 1 TABLET BY MOUTH EVERY DAY 30 tablet 11   nitrofurantoin, macrocrystal-monohydrate, (MACROBID) 100 MG capsule Take 1 capsule (100 mg total) by mouth 2 (two) times daily. (Patient not taking: Reported on 07/21/2023) 14 capsule 0   omeprazole (PRILOSEC) 20 MG capsule Take 1 capsule (20 mg total) by mouth 2 (two) times daily before a meal for 14 days. 28 capsule 0   No current facility-administered medications for this visit.   No Known Allergies  Past Medical History:  Diagnosis Date   Asthma    Bronchiectasis (HCC)    GERD (gastroesophageal reflux disease)    Past Surgical History:  Procedure Laterality Date   APPENDECTOMY  1999   CESAREAN SECTION  2019   CHOLECYSTECTOMY  09/2013   NASAL SINUS SURGERY  2014   WISDOM TOOTH EXTRACTION Bilateral  11/2020   OB History     Gravida  0   Para  0   Term  0   Preterm  0   AB  0   Living  0      SAB  0   IAB  0   Ectopic  0   Multiple  0   Live Births  0          Social History   Tobacco Use   Smoking status: Never    Passive exposure: Never   Smokeless tobacco: Never  Substance Use Topics   Alcohol use: Yes    Comment: occassional   Social History   Substance and Sexual  Activity  Sexual Activity Yes   Birth control/protection: Condom    Immunization History  Administered Date(s) Administered   Influenza-Unspecified 02/25/2021, 03/03/2022, 02/22/2023   PNEUMOCOCCAL CONJUGATE-20 06/21/2023   Pfizer Covid-19 Vaccine Bivalent Booster 72yrs & up 06/10/2020   Tdap 06/21/2023     Review Of Systems  Constitutional: Denied constitutional symptoms, night sweats, recent illness, fatigue, fever, insomnia and weight loss.  Eyes: Denied eye symptoms, eye pain, photophobia, vision change and visual disturbance.  Ears/Nose/Throat/Neck: Denied ear, nose, throat or neck symptoms, hearing loss, nasal discharge, sinus congestion and sore throat.  Cardiovascular: Denied cardiovascular symptoms, arrhythmia, chest pain/pressure, edema, exercise intolerance, orthopnea and palpitations.  Respiratory: Denied pulmonary symptoms, asthma, pleuritic pain, productive sputum, cough, dyspnea and wheezing.  Gastrointestinal: Denied, gastro-esophageal reflux, melena, nausea and vomiting.  Genitourinary: Vaginal discharge with odor, urinary frequency   Musculoskeletal: Denied musculoskeletal symptoms, stiffness, swelling, muscle weakness and myalgia.  Dermatologic: Denied dermatology symptoms, rash and scar.  Neurologic: Denied neurology symptoms, dizziness, headache, neck pain and syncope.  Psychiatric: Denied psychiatric symptoms, anxiety and depression.  Endocrine: Denied endocrine symptoms including hot flashes and night sweats.      Objective:    BP 105/67 (BP Location:  Right Arm, Patient Position: Sitting, Cuff Size: Normal)   Pulse 76   Ht 5\' 5"  (1.651 m)   Wt 142 lb 4.8 oz (64.5 kg)   LMP 06/17/2023 (Approximate)   BMI 23.68 kg/m   Constitutional: Well-developed, well-nourished female in no acute distress Neurological: Alert and oriented to person, place, and time Psychiatric: Mood and affect appropriate Skin: No rashes or lesions Neck: Supple without masses. Trachea is midline.Thyroid is normal size without masses Lymphatics: No cervical, axillary, supraclavicular, or inguinal adenopathy noted Respiratory: Clear to auscultation bilaterally. Good air movement with normal work of breathing. Cardiovascular: Regular rate and rhythm. Extremities grossly normal, nontender with no edema; pulses regular Gastrointestinal: Soft, nontender, nondistended. No masses or hernias appreciated. No hepatosplenomegaly. No fluid wave. No rebound or guarding. Breast Exam: normal appearance, no masses or tenderness Genitourinary:         External Genitalia: Normal female genitalia    Vagina: Intact, well rugeated  no lesions. Good tone     Cervix: No lesions, normal size and consistency; no cervical motion tenderness     Uterus: Normal size and contour; smooth, mobile, NT, retroverted. Adnexae: Non-palpable and non-tender Perineum/Anus: No lesions Rectal: intact     Carie Caddy, CNM   [facility]  07/23/23  11:37 AM

## 2023-07-23 LAB — HEP, RPR, HIV PANEL
HIV Screen 4th Generation wRfx: NONREACTIVE
Hepatitis B Surface Ag: NEGATIVE
RPR Ser Ql: NONREACTIVE

## 2023-07-23 LAB — HEPATITIS C ANTIBODY: Hep C Virus Ab: NONREACTIVE

## 2023-07-24 LAB — CERVICOVAGINAL ANCILLARY ONLY
Bacterial Vaginitis (gardnerella): NEGATIVE
Candida Glabrata: NEGATIVE
Candida Vaginitis: NEGATIVE
Chlamydia: NEGATIVE
Comment: NEGATIVE
Comment: NEGATIVE
Comment: NEGATIVE
Comment: NEGATIVE
Comment: NEGATIVE
Comment: NORMAL
Neisseria Gonorrhea: NEGATIVE
Trichomonas: NEGATIVE

## 2023-07-25 ENCOUNTER — Encounter: Payer: Self-pay | Admitting: Licensed Practical Nurse

## 2023-07-26 LAB — CYTOLOGY - PAP
Chlamydia: NEGATIVE
Comment: NEGATIVE
Comment: NEGATIVE
Comment: NEGATIVE
Comment: NEGATIVE
Comment: NEGATIVE
Comment: NORMAL
HPV 16: NEGATIVE
HPV 18 / 45: NEGATIVE
High risk HPV: POSITIVE — AB
Neisseria Gonorrhea: NEGATIVE
Trichomonas: NEGATIVE

## 2023-08-08 IMAGING — DX DG CHEST 2V
2 series · 2 of 2 positions shown · non-contrast
Comparison: 03/31/2021 chest radiograph and 05/06/2021 CT

CLINICAL DATA: 35-year-old female with cough for 2 days. History of
bronchiectasis and chronic pulmonary changes.

EXAM:
CHEST - 2 VIEW

[chest pa]
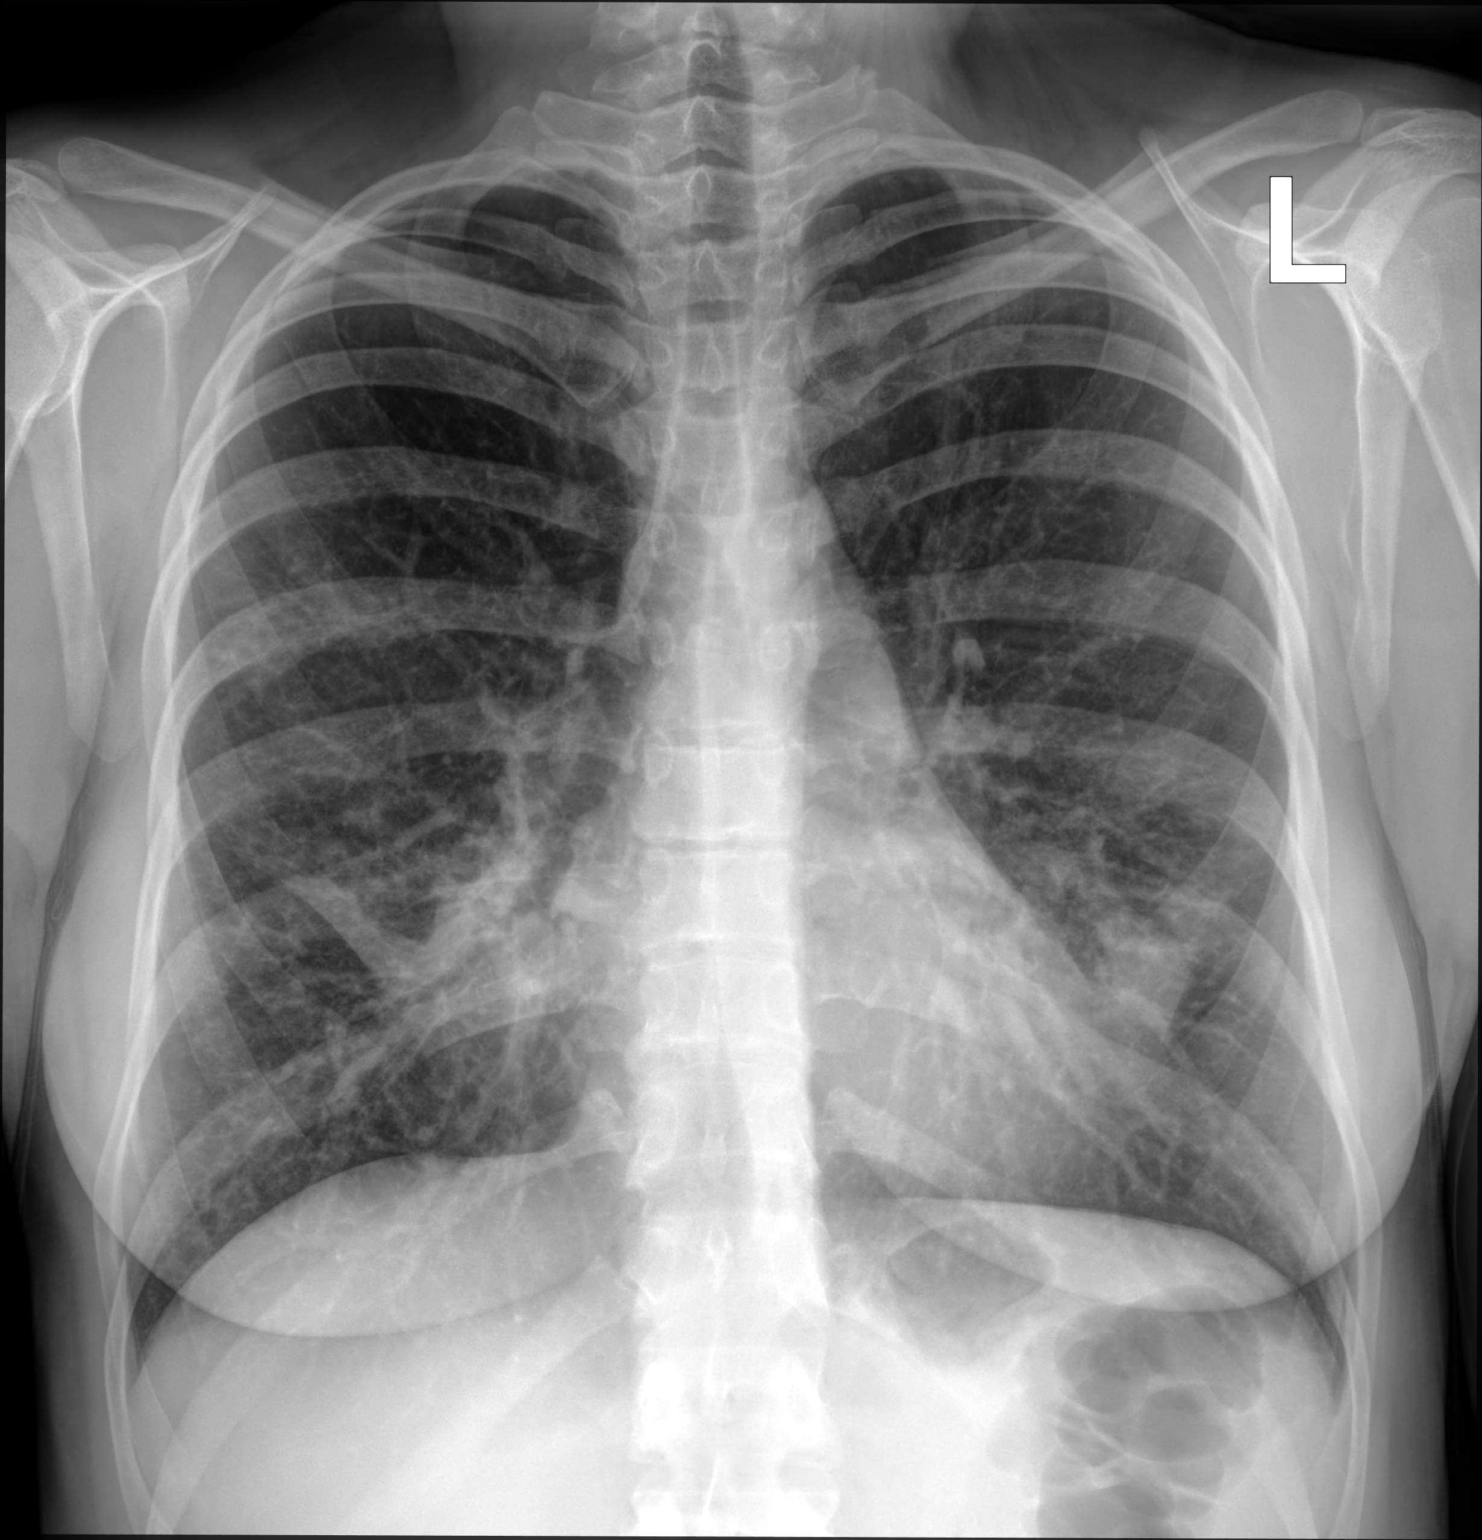

[chest lat]
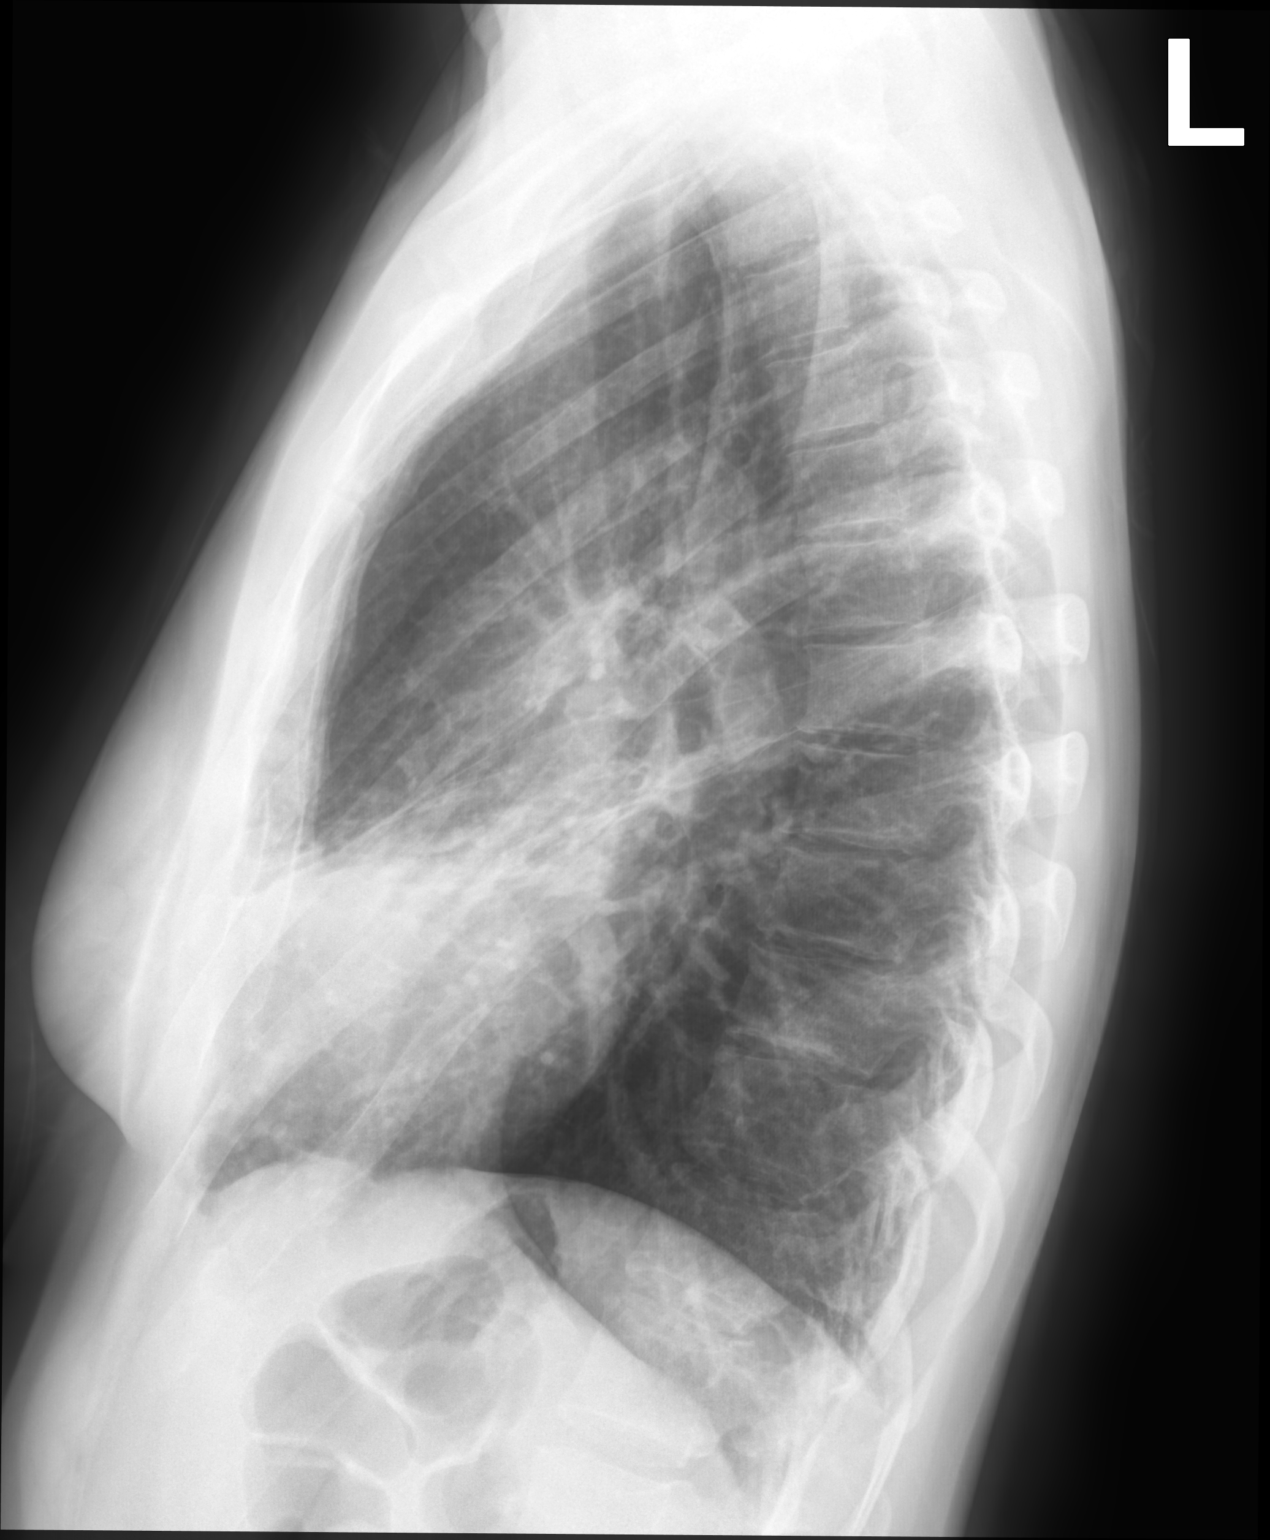

[2 of 2 positions shown; findings below may reference images not displayed]

FINDINGS: RIGHT middle lobe and lingular chronic opacities do not appear
significantly changed but difficult to exclude acute superimposed
infection.

The cardiomediastinal silhouette is unremarkable.

No pleural effusion or pneumothorax.

No acute bony abnormalities are present.
IMPRESSION: Chronic RIGHT middle lobe and lingular opacities which do not appear
significantly changed from 03/31/2021, but difficult to exclude
acute superimposed infection.

## 2023-08-11 ENCOUNTER — Encounter: Admitting: Obstetrics and Gynecology

## 2023-08-11 ENCOUNTER — Encounter

## 2023-08-11 DIAGNOSIS — R87612 Low grade squamous intraepithelial lesion on cytologic smear of cervix (LGSIL): Secondary | ICD-10-CM

## 2023-08-11 DIAGNOSIS — B977 Papillomavirus as the cause of diseases classified elsewhere: Secondary | ICD-10-CM

## 2023-09-06 ENCOUNTER — Ambulatory Visit: Admitting: Obstetrics and Gynecology

## 2023-09-06 ENCOUNTER — Encounter

## 2023-09-06 ENCOUNTER — Other Ambulatory Visit (HOSPITAL_COMMUNITY)
Admission: RE | Admit: 2023-09-06 | Discharge: 2023-09-06 | Disposition: A | Discharge status: Home / Self Care | Source: Ambulatory Visit | Attending: Obstetrics and Gynecology | Admitting: Obstetrics and Gynecology

## 2023-09-06 ENCOUNTER — Encounter: Payer: Self-pay | Admitting: Obstetrics and Gynecology

## 2023-09-06 VITALS — BP 99/71 | HR 68 | Resp 16 | Ht 65.0 in | Wt 146.7 lb

## 2023-09-06 DIAGNOSIS — Z3202 Encounter for pregnancy test, result negative: Secondary | ICD-10-CM

## 2023-09-06 DIAGNOSIS — N888 Other specified noninflammatory disorders of cervix uteri: Secondary | ICD-10-CM

## 2023-09-06 DIAGNOSIS — R87810 Cervical high risk human papillomavirus (HPV) DNA test positive: Secondary | ICD-10-CM | POA: Diagnosis present

## 2023-09-06 DIAGNOSIS — Z23 Encounter for immunization: Secondary | ICD-10-CM | POA: Diagnosis not present

## 2023-09-06 DIAGNOSIS — R87612 Low grade squamous intraepithelial lesion on cytologic smear of cervix (LGSIL): Secondary | ICD-10-CM

## 2023-09-06 LAB — POCT URINE PREGNANCY: Preg Test, Ur: NEGATIVE

## 2023-09-06 NOTE — Progress Notes (Signed)
     GYNECOLOGY OFFICE COLPOSCOPY PROCEDURE NOTE  38 y.o. G0P0000 here for colposcopy for low-grade squamous intraepithelial neoplasia (LGSIL - encompassing HPV,mild dysplasia,CIN I) pap smear on 07/21/2023. Discussed role for HPV in cervical dysplasia, need for surveillance. Notes she has not had a cycle since January.  Is not currently sexually active.  Notes that her cycles have been irregular for the past 3 years.   Patient gave informed written consent, time out was performed. Urine pregnancy test performed today, negative. Placed in lithotomy position. Cervix viewed with speculum and colposcope after application of acetic acid.   Colposcopy adequate? Yes  no visible lesions, no mosaicism, no punctation, and no abnormal vasculature; no corresponding biopsies obtained.  ECC specimen obtained. All specimens were labeled and sent to pathology.  Chaperone was present during entire procedure.  Patient was given post procedure instructions.  Will follow up pathology and manage accordingly; patient will be contacted with results and recommendations.  Routine preventative health maintenance measures emphasized.  Discussed HPV vaccine, patient ok to receive first vaccination today.  Given handout. Will return in 1 month for 2nd vaccination.     Teresa Fender, MD [facility]

## 2023-09-06 NOTE — Patient Instructions (Addendum)
 Colposcopy  Colposcopy is a procedure to examine the lowest part of the uterus (cervix) for abnormalities or signs of disease. This procedure is done using an instrument that makes objects appear larger and provides light (colposcope). During the procedure, your health care provider may remove a tissue sample to look at under a microscope (biopsy). A biopsy may be done if any unusual cells are seen during the colposcopy. You may have a colposcopy if you have: An abnormal Pap smear, also called a Pap test. This screening test is used to check for signs of cancer or infection of the vagina, cervix, and uterus. An HPV (human papillomavirus) test and get a positive result for a type of HPV that puts you at high risk of cancer. Certain conditions or symptoms, such as: A sore, or lesion, on your cervix. Genital warts on your vulva, vagina, or cervix. Pain during sex. Vaginal bleeding, especially after sex. A growth on your cervix (cervical polyp) that needs to be removed. Let your health care provider know about: Any allergies you have, including allergies to medicines, latex, or iodine. All medicines you are taking, including vitamins, herbs, eye drops, creams, and over-the-counter medicines. Any bleeding problems you have. Any surgeries you have had. Any medical conditions you have, such as pelvic inflammatory disease (PID) or an endometrial disorder. The pattern of your menstrual cycles and the form of birth control (contraception) you use, if any. Your medical history, including any cervical treatments and how well you tolerated the procedure (if you have ever fainted). Whether you are pregnant or may be pregnant. What are the risks? Generally, this is a safe procedure. However, problems may occur, including: Infection. Symptoms of infection may include fever, bad-smelling vaginal discharge, or pelvic pain. Vaginal bleeding. Allergic reactions to medicines. Damage to nearby structures or  organs. What happens before the procedure? Medicines Ask your health care provider about: Changing or stopping your regular medicines. This is especially important if you are taking diabetes medicines or blood thinners. Taking medicines such as aspirin and ibuprofen. These medicines can thin your blood. Do not take these medicines unless your health care provider tells you to take them. Your health care provider will likely tell you to avoid taking aspirin, or medicine that contains aspirin, for 7 days before the procedure. Taking over-the-counter medicines, vitamins, herbs, and supplements. General instructions Tell your health care provider if you have your menstrual period now or will have it at the time of your procedure. A colposcopy is not normally done during your menstrual period. If you use contraception, continue to use it before your procedure. For 24 hours before the procedure: Do not use douche products or tampons. Do not use medicines, creams, or suppositories in the vagina. Do not have sex or insert anything into your vagina. Ask your health care provider what steps will be taken to prevent infection. What happens during the procedure? You will lie down on your back, with your feet in foot rests (stirrups). An instrument called a speculum will be inserted into your vagina. This will be used so your health care provider can see your cervix and the inside of your vagina. A cotton swab will be used to place a small amount of a liquid (solution) on the areas to be examined. This solution makes it easier to see abnormal cells. You may feel a slight burning during this part. The colposcope will be used to scan the cervix with a bright white light. The colposcope will be held near  your vulva and will make your vulva, vagina, and cervix look bigger so they can be seen better. If a biopsy is needed: You may be given a medicine to numb the area (local anesthetic). Surgical tools will be  used to remove mucus and cells through your vagina. You may feel mild pain while the tissue sample is removed. Bleeding may occur. A solution may be used to stop the bleeding. The tissue removed will be sent to a lab to be looked at under a microscope. The procedure may vary among health care providers and hospitals. What happens after the procedure? You may have some cramping in your abdomen. This should go away after a few minutes. It is up to you to get the results of your procedure. Ask your health care provider, or the department that is doing the procedure, when your results will be ready. Summary Colposcopy is a procedure to examine the lowest part of the uterus (cervix), for signs of disease. A biopsy may be done as part of the procedure. You may have some cramping in your abdomen. This should go away after a few minutes. It is up to you to get the results of your procedure. Ask your health care provider, or the department that is doing the procedure, when your results will be ready. This information is not intended to replace advice given to you by your health care provider. Make sure you discuss any questions you have with your health care provider. Document Revised: 10/05/2020 Document Reviewed: 10/05/2020 Elsevier Patient Education  2024 [employer].

## 2023-09-16 LAB — SURGICAL PATHOLOGY

## 2023-09-20 ENCOUNTER — Encounter: Payer: Self-pay | Admitting: Obstetrics and Gynecology

## 2023-11-07 ENCOUNTER — Ambulatory Visit (INDEPENDENT_AMBULATORY_CARE_PROVIDER_SITE_OTHER)

## 2023-11-07 VITALS — BP 101/68 | HR 66 | Ht 65.0 in | Wt 146.0 lb

## 2023-11-07 DIAGNOSIS — Z23 Encounter for immunization: Secondary | ICD-10-CM | POA: Diagnosis not present

## 2023-11-07 NOTE — Progress Notes (Signed)
    NURSE VISIT NOTE  Subjective:    Patient ID: Jeanette Sandoval, female    DOB: 24-Apr-1986, 38 y.o.   MRN: [id number]  HPI  Patient is a 38 y.o. G0P0000 female who presents for her second Gardasil injection. Order to administer given by Teresa Fender, MD on 09/06/23.   Objective:    There were no vitals taken for this visit.  Given by: Ila Malay, CMA Site:  right deltoid      Assessment:   1. Need for HPV vaccine      Plan:   Patient will return in 4 months for third injection.    Edom Schmuhl H Geraldy Akridge, CMA

## 2023-11-16 NOTE — Progress Notes (Unsigned)
 Alvia Selinda PARAS, MD   No chief complaint on file.   HPI:      [name] is a 38 y.o. G0P0000 whose LMP was No LMP recorded., presents today for [facility] consult, used pills and depo in past but didn't like how she felt.   Abn pap 2/25 with neg colpo bx.     Patient Active Problem List   Diagnosis Date Noted   Tinea pedis of both feet 06/22/2023   Urine malodor 06/21/2023   Cytology examination positive for high risk human papillomavirus (HPV) 06/21/2023   Symptomatic cholelithiasis 10/06/2022   Gastroesophageal reflux disease with esophagitis without hemorrhage 08/23/2022   Other constipation 08/23/2022   Anxiety and depression 06/08/2022   Chest pain of uncertain etiology 06/08/2022   Annual physical exam 03/08/2022   Cervical cancer screening 03/08/2022   Irregular menses 03/08/2022   Malar rash 03/02/2021   Bronchiectasis without complication (HCC) 03/02/2021   Allergic rhinitis 03/02/2021   Breast pain, left 03/02/2021    Past Surgical History:  Procedure Laterality Date   APPENDECTOMY  1999   CESAREAN SECTION  2019   CHOLECYSTECTOMY  09/2013   NASAL SINUS SURGERY  2014   WISDOM TOOTH EXTRACTION Bilateral 11/2020    Family History  Problem Relation Age of Onset   Lupus Mother    Hypertension Father    Coronary artery disease Maternal Grandmother    Peripheral Artery Disease Maternal Grandmother    Liver cancer Paternal Grandmother    Stomach cancer Maternal Aunt    Ovarian cancer Maternal Aunt    Breast cancer Neg Hx     Social History   Socioeconomic History   Marital status: Married    Spouse name: Dala Atlas   Number of children: 1   Years of education: 16   Highest education level: Bachelor's degree (e.g., BA, AB, BS)  Occupational History   Occupation: Facilities manager  Tobacco Use   Smoking status: Never    Passive exposure: Never   Smokeless tobacco: Never  Vaping Use   Vaping status: Never Used  Substance and Sexual  Activity   Alcohol use: Yes    Comment: occassional   Drug use: Never   Sexual activity: Yes    Birth control/protection: Condom  Other Topics Concern   Not on file  Social History Narrative   Not on file   Social Drivers of Health   Financial Resource Strain: Not on file  Food Insecurity: No Food Insecurity (03/08/2022)   Hunger Vital Sign    Worried About Running Out of Food in the Last Year: Never true    Ran Out of Food in the Last Year: Never true  Transportation Needs: No Transportation Needs (03/08/2022)   PRAPARE - Administrator, Civil Service (Medical): No    Lack of Transportation (Non-Medical): No  Physical Activity: Not on file  Stress: Not on file  Social Connections: Not on file  Intimate Partner Violence: Not At Risk (03/08/2022)   Humiliation, Afraid, Rape, and Kick questionnaire    Fear of Current or Ex-Partner: No    Emotionally Abused: No    Physically Abused: No    Sexually Abused: No    Outpatient Medications Prior to Visit  Medication Sig Dispense Refill   albuterol  (VENTOLIN  HFA) 108 (90 Base) MCG/ACT inhaler Inhale 2 puffs into the lungs every 6 (six) hours as needed for wheezing or shortness of breath. 8 g 2   budesonide -formoterol  (SYMBICORT ) 160-4.5  MCG/ACT inhaler Inhale 2 puffs into the lungs 2 (two) times daily. 1 each 11   clotrimazole -betamethasone  (LOTRISONE ) cream Apply 1 Application topically 2 (two) times daily. X 2 weeks 45 g 0   montelukast  (SINGULAIR ) 10 MG tablet TAKE 1 TABLET BY MOUTH EVERY DAY 30 tablet 11   nitrofurantoin , macrocrystal-monohydrate, (MACROBID ) 100 MG capsule Take 1 capsule (100 mg total) by mouth 2 (two) times daily. 14 capsule 0   omeprazole  (PRILOSEC) 20 MG capsule Take 1 capsule (20 mg total) by mouth 2 (two) times daily before a meal for 14 days. 28 capsule 0   No facility-administered medications prior to visit.      ROS:  Review of Systems BREAST: No symptoms   OBJECTIVE:   Vitals:   There were no vitals taken for this visit.  Physical Exam  Results: No results found for this or any previous visit (from the past 24 hours).   Assessment/Plan: No diagnosis found.    No orders of the defined types were placed in this encounter.     No follow-ups on file.  Destanie Tibbetts B. Weiland Tomich, PA-C 11/16/2023 4:02 PM

## 2023-11-17 ENCOUNTER — Encounter: Payer: Self-pay | Admitting: Obstetrics and Gynecology

## 2023-11-17 ENCOUNTER — Ambulatory Visit: Admitting: Obstetrics and Gynecology

## 2023-11-17 VITALS — BP 99/67 | HR 80 | Ht 65.0 in | Wt 147.0 lb

## 2023-11-17 DIAGNOSIS — Z30014 Encounter for initial prescription of intrauterine contraceptive device: Secondary | ICD-10-CM | POA: Diagnosis not present

## 2023-11-17 DIAGNOSIS — Z30430 Encounter for insertion of intrauterine contraceptive device: Secondary | ICD-10-CM

## 2023-11-17 MED ORDER — PARAGARD INTRAUTERINE COPPER IU IUD
1.0000 | INTRAUTERINE_SYSTEM | Freq: Once | INTRAUTERINE | Status: AC
  Administered 2023-11-17: 1 via INTRAUTERINE

## 2023-11-17 NOTE — Patient Instructions (Addendum)
 I value your feedback and you entrusting [place] with your care. If you get a  patient survey, I would appreciate you taking the time to let us know about your experience today. Thank you!  Instructions after IUD insertion  Most women experience no significant problems after insertion of an IUD, however minor cramping and spotting for a few days is common. Cramps may be treated with ibuprofen 800mg  every 8 hours or Tylenol 650 mg every 4 hours. Contact [facility] immediately if you experience any of the following symptoms during the next week: temperature >99.6 degrees, worsening pelvic pain, abdominal pain, fainting, unusually heavy vaginal bleeding, foul vaginal discharge, or if you think you have expelled the IUD. Nothing inserted in the vagina for 48 hours. You will be scheduled for a follow up visit in approximately four weeks.

## 2023-12-13 NOTE — Progress Notes (Deleted)
   No chief complaint on file.  GET UPT!!!   History of Present Illness:  Kaiden Dardis is a 38 y.o. that had a Paragard  IUD placed approximately 1 month ago. Since that time, she denies dyspareunia, pelvic pain, non-menstrual bleeding, vaginal d/c, heavy bleeding.   Review of Systems  Physical Exam:  LMP 11/09/2023 (Exact Date)  There is no height or weight on file to calculate BMI.  Pelvic exam:  Two IUD strings {DESC; PRESENT/ABSENT:17923} seen coming from the cervical os. EGBUS, vaginal vault and cervix: within normal limits   Assessment:   No diagnosis found.  IUD strings present in proper location; pt doing well  Plan: F/u if any signs of infection or can no longer feel the strings.   Davanna He B. Tyrena Gohr, PA-C 12/13/2023 3:29 PM

## 2023-12-15 ENCOUNTER — Ambulatory Visit: Admitting: Obstetrics and Gynecology

## 2023-12-15 DIAGNOSIS — Z30431 Encounter for routine checking of intrauterine contraceptive device: Secondary | ICD-10-CM

## 2024-01-01 ENCOUNTER — Encounter: Payer: Self-pay | Admitting: Gastroenterology

## 2024-01-03 ENCOUNTER — Other Ambulatory Visit
Admission: RE | Admit: 2024-01-03 | Discharge: 2024-01-03 | Disposition: A | Discharge status: Home / Self Care | Source: Home / Self Care | Attending: Family Medicine | Admitting: Family Medicine

## 2024-01-03 ENCOUNTER — Encounter: Payer: Self-pay | Admitting: Family Medicine

## 2024-01-03 ENCOUNTER — Other Ambulatory Visit (HOSPITAL_COMMUNITY)
Admission: RE | Admit: 2024-01-03 | Discharge: 2024-01-03 | Disposition: A | Discharge status: Home / Self Care | Source: Ambulatory Visit | Attending: Family Medicine | Admitting: Family Medicine

## 2024-01-03 ENCOUNTER — Ambulatory Visit (INDEPENDENT_AMBULATORY_CARE_PROVIDER_SITE_OTHER): Admitting: Family Medicine

## 2024-01-03 ENCOUNTER — Other Ambulatory Visit: Payer: Self-pay | Admitting: Family Medicine

## 2024-01-03 ENCOUNTER — Telehealth: Payer: Self-pay | Admitting: Family Medicine

## 2024-01-03 ENCOUNTER — Ambulatory Visit
Admission: RE | Admit: 2024-01-03 | Discharge: 2024-01-03 | Disposition: A | Discharge status: Home / Self Care | Source: Ambulatory Visit | Attending: Family Medicine | Admitting: Family Medicine

## 2024-01-03 VITALS — BP 112/74 | HR 81 | Ht 65.0 in | Wt 147.6 lb

## 2024-01-03 DIAGNOSIS — K2100 Gastro-esophageal reflux disease with esophagitis, without bleeding: Secondary | ICD-10-CM | POA: Diagnosis not present

## 2024-01-03 DIAGNOSIS — Z0000 Encounter for general adult medical examination without abnormal findings: Secondary | ICD-10-CM | POA: Insufficient documentation

## 2024-01-03 DIAGNOSIS — J309 Allergic rhinitis, unspecified: Secondary | ICD-10-CM | POA: Diagnosis not present

## 2024-01-03 DIAGNOSIS — R1084 Generalized abdominal pain: Secondary | ICD-10-CM | POA: Diagnosis present

## 2024-01-03 DIAGNOSIS — K5909 Other constipation: Secondary | ICD-10-CM

## 2024-01-03 DIAGNOSIS — R8290 Unspecified abnormal findings in urine: Secondary | ICD-10-CM | POA: Diagnosis not present

## 2024-01-03 DIAGNOSIS — R7401 Elevation of levels of liver transaminase levels: Secondary | ICD-10-CM

## 2024-01-03 DIAGNOSIS — N926 Irregular menstruation, unspecified: Secondary | ICD-10-CM

## 2024-01-03 DIAGNOSIS — J479 Bronchiectasis, uncomplicated: Secondary | ICD-10-CM

## 2024-01-03 LAB — COMPREHENSIVE METABOLIC PANEL WITH GFR
ALT: 783 U/L — ABNORMAL HIGH (ref 0–44)
AST: 871 U/L — ABNORMAL HIGH (ref 15–41)
Albumin: 4.5 g/dL (ref 3.5–5.0)
Alkaline Phosphatase: 254 U/L — ABNORMAL HIGH (ref 38–126)
Anion gap: 10 (ref 5–15)
BUN: 13 mg/dL (ref 6–20)
CO2: 25 mmol/L (ref 22–32)
Calcium: 9.3 mg/dL (ref 8.9–10.3)
Chloride: 104 mmol/L (ref 98–111)
Creatinine, Ser: 0.67 mg/dL (ref 0.44–1.00)
GFR, Estimated: >60 mL/min (ref >60)
Glucose, Bld: 92 mg/dL (ref 70–99)
Potassium: 4 mmol/L (ref 3.5–5.1)
Sodium: 139 mmol/L (ref 135–145)
Total Bilirubin: 1.1 mg/dL (ref 0.0–1.2)
Total Protein: 8.6 g/dL — ABNORMAL HIGH (ref 6.5–8.1)

## 2024-01-03 LAB — URINALYSIS, W/ REFLEX TO CULTURE (INFECTION SUSPECTED)
Glucose, UA: NEGATIVE mg/dL
Hgb urine dipstick: NEGATIVE
Nitrite: NEGATIVE
Specific Gravity, Urine: 1.025 (ref 1.005–1.030)
pH: 5.5 (ref 5.0–8.0)

## 2024-01-03 LAB — CBC
HCT: 44 % (ref 36.0–46.0)
Hemoglobin: 14.8 g/dL (ref 12.0–15.0)
MCH: 29.6 pg (ref 26.0–34.0)
MCHC: 33.6 g/dL (ref 30.0–36.0)
MCV: 88 fL (ref 80.0–100.0)
Platelets: 286 K/uL (ref 150–400)
RBC: 5 MIL/uL (ref 3.87–5.11)
RDW: 14.1 % (ref 11.5–15.5)
WBC: 6 K/uL (ref 4.0–10.5)
nRBC: 0 % (ref 0.0–0.2)

## 2024-01-03 LAB — LIPID PANEL
Cholesterol: 183 mg/dL (ref 0–200)
HDL: 66 mg/dL (ref >40)
LDL Cholesterol: 100 mg/dL — ABNORMAL HIGH (ref 0–99)
Total CHOL/HDL Ratio: 2.8 ratio
Triglycerides: 84 mg/dL (ref <150)
VLDL: 17 mg/dL (ref 0–40)

## 2024-01-03 MED ORDER — PANTOPRAZOLE SODIUM 20 MG PO TBEC: 20.0000 mg | DELAYED_RELEASE_TABLET | Freq: Every day | ORAL | 3 refills | Status: AC

## 2024-01-03 MED ORDER — IOHEXOL 9 MG/ML PO SOLN
500.0000 mL | ORAL | Status: AC
  Administered 2024-01-03 (×2): 500 mL via ORAL

## 2024-01-03 MED ORDER — NITROFURANTOIN MONOHYD MACRO 100 MG PO CAPS: 100.0000 mg | ORAL_CAPSULE | Freq: Two times a day (BID) | ORAL | 0 refills | Status: DC

## 2024-01-03 MED ORDER — DICYCLOMINE HCL 10 MG PO CAPS: 10.0000 mg | ORAL_CAPSULE | Freq: Three times a day (TID) | ORAL | 0 refills | Status: DC

## 2024-01-03 MED ORDER — IOHEXOL 300 MG/ML  SOLN
80.0000 mL | Freq: Once | INTRAMUSCULAR | Status: AC | PRN
  Administered 2024-01-03 (×2): 80 mL via INTRAVENOUS

## 2024-01-03 MED ORDER — ALBUTEROL SULFATE HFA 108 (90 BASE) MCG/ACT IN AERS: 1.0000 | INHALATION_SPRAY | Freq: Four times a day (QID) | RESPIRATORY_TRACT | 2 refills | Status: AC | PRN

## 2024-01-03 NOTE — Assessment & Plan Note (Signed)
 Symptoms currently well-controlled. - Continue to maintain routine follow-up with pulmonology

## 2024-01-03 NOTE — Assessment & Plan Note (Addendum)
 Abdominal pain and gastroesophageal reflux symptoms - Significant abdominal pain localized around the ribcage, associated with eating certain foods (specific triggers unclear) - Pain described as intense, radiating throughout the abdomen, and resolves spontaneously over time - Bloating and sweating precede the onset of pain - Reflux symptoms present - Management includes omeprazole  and buscapin, sometimes requiring two doses for relief - No recent use of pantoprazole  - Completed H. pylori treatment last year without follow-up testing for eradication - No access to regular medical care for the past year  Helicobacter pylori infection, status post treatment, needs re-evaluation Previously treated for H. pylori. Importance of confirming eradication discussed. - Order H. pylori breath test.  Gastroesophageal reflux disease with epigastric and generalized abdominal pain Recurrent abdominal pain likely exacerbated by diet. Symptoms suggest gastritis. Consider IBS. Discussed H. pylori's role in acid production and need for re-evaluation. - Order H. pylori breath test. - Prescribe pantoprazole  daily on an empty stomach. - Advise dietary modifications to avoid spicy, greasy, and fried foods. - Discuss potential need for GI referral if H. pylori test is negative.

## 2024-01-03 NOTE — Progress Notes (Signed)
    Primary Care / Sports Medicine Telephone Note  Patient Information:  Patient ID: Jeanette Sandoval, female DOB: 28-Apr-1986 Age: 38 y.o. MRN: [id number]    Spoke to patient this evening to review CT abdomen pelvis results.  Additionally discussed labs showing signs for UTI.  She denied any new or consistent exposures to medications/supplements not listed on her medical record.   We reviewed next steps as follows: - Additional labs ordered to further evaluate abdominal pain, transaminitis, alkaline phosphatase elevation - Patient will dose Macrobid  twice daily x 7 days, await culture results - Right upper quadrant abdominal ultrasound with liver Doppler ordered - Over the interim, patient to restart pantoprazole  and trial Bentyl  - Will follow results and discuss next steps accordingly  Selinda JINNY Ku, MD, [facility]   Primary Care Sports Medicine Primary Care and Sports Medicine at [facility]

## 2024-01-03 NOTE — Assessment & Plan Note (Signed)
 Urinary symptoms - Change in urinary odor and increased urgency over the past few days - No associated pain or discharge  Bacterial vaginosis, suspected Suspected due to urinary urgency and odor.  - Perform self-swab for bacterial vaginosis. - Order urine test - Will contact with lab results and next steps as applicable.

## 2024-01-03 NOTE — Assessment & Plan Note (Signed)
 Menstrual irregularity - Irregular menstrual cycles since ParaGard  IUD insertion in June - Amenorrhea since IUD placement - Upcoming gynecology appointment in September for evaluation, advised to keep visit

## 2024-01-03 NOTE — Telephone Encounter (Signed)
    Primary Care / Sports Medicine Telephone Note  Patient Information:  Patient ID: Jeanette Sandoval, female DOB: 28-Apr-1986 Age: 38 y.o. MRN: [id number]    Spoke to patient this evening to review CT abdomen pelvis results.  Additionally discussed labs showing signs for UTI.  She denied any new or consistent exposures to medications/supplements not listed on her medical record.   We reviewed next steps as follows: - Additional labs ordered to further evaluate abdominal pain, transaminitis, alkaline phosphatase elevation - Patient will dose Macrobid  twice daily x 7 days, await culture results - Right upper quadrant abdominal ultrasound with liver Doppler ordered - Over the interim, patient to restart pantoprazole  and trial Bentyl  - Will follow results and discuss next steps accordingly  Selinda JINNY Ku, MD, [facility]   Primary Care Sports Medicine Primary Care and Sports Medicine at [facility]

## 2024-01-03 NOTE — Progress Notes (Signed)
 Annual Physical Exam Visit  Patient Information:  Patient ID: Jeanette Sandoval, female DOB: 02-16-1986 Age: 38 y.o. MRN: [id number]   Subjective:   CC: Annual Physical Exam  HPI:  Jeanette Sandoval is here for their annual physical.  I reviewed the past medical history, family history, social history, surgical history, and allergies today and changes were made as necessary.  Please see the problem list section below for additional details.  Past Medical History: Past Medical History:  Diagnosis Date   Asthma    Bronchiectasis (HCC)    Chest pain of uncertain etiology 06/08/2022   ED HPI   Jeanette Sandoval is a 38 y.o. female with history of asthma, bronchiectasis who presents to the emergency department with sudden onset chest pain and shortness of breath that woke her from sleep.  Chest pain is mostly in the left side of her chest and radiates under her arm.  She does have a pleuritic component to her chest pain.  No fevers or cough.  No lower extremity swelling or pain.  N   GERD (gastroesophageal reflux disease)    Symptomatic cholelithiasis 10/06/2022   Past Surgical History: Past Surgical History:  Procedure Laterality Date   APPENDECTOMY  1999   CESAREAN SECTION  2019   CHOLECYSTECTOMY  09/2013   NASAL SINUS SURGERY  2014   WISDOM TOOTH EXTRACTION Bilateral 11/2020   Family History: Family History  Problem Relation Age of Onset   Lupus Mother    Cervical cancer Mother        30/40s   Hypertension Father    Colon cancer Maternal Aunt        or Stomach   Coronary artery disease Maternal Grandmother    Peripheral Artery Disease Maternal Grandmother    Liver cancer Paternal Grandmother    Breast cancer Neg Hx    Allergies: No Known Allergies Health Maintenance: Health Maintenance  Topic Date Due   COVID-19 Vaccine (2 - 2024-25 season) 01/19/2024 (Originally 01/23/2023)   INFLUENZA VACCINE  08/21/2024 (Originally 12/23/2023)   Hepatitis B Vaccines (1 of 3 - 19+  3-dose series) 01/02/2025 (Originally 05/20/2007)   HPV VACCINES (3 - 3-dose SCDM series) 01/02/2025 (Originally 03/07/2024)   Cervical Cancer Screening (HPV/Pap Cotest)  07/20/2028   DTaP/Tdap/Td (2 - Td or Tdap) 06/20/2033   Pneumococcal Vaccine: 19-49 Years  Completed   Hepatitis C Screening  Completed   HIV Screening  Completed   Meningococcal B Vaccine  Aged Out    HM Colonoscopy   This patient has no relevant Health Maintenance data.    Medications: Current Outpatient Medications on File Prior to Visit  Medication Sig Dispense Refill   budesonide -formoterol  (SYMBICORT ) 160-4.5 MCG/ACT inhaler Inhale 2 puffs into the lungs 2 (two) times daily. 1 each 11   montelukast  (SINGULAIR ) 10 MG tablet TAKE 1 TABLET BY MOUTH EVERY DAY 30 tablet 11   PARAGARD  INTRAUTERINE COPPER  IU 1 each by Intrauterine route once.     No current facility-administered medications on file prior to visit.    Objective:   Vitals:   01/03/24 1030  BP: 112/74  Pulse: 81  SpO2: 99%   Vitals:   01/03/24 1030  Weight: 147 lb 9.6 oz (67 kg)  Height: 5' 5 (1.651 m)   Body mass index is 24.56 kg/m.  General: Well Developed, well nourished, and in no acute distress.  Neuro: Alert and oriented x3, extra-ocular muscles intact, sensation grossly intact. Cranial nerves II through XII are grossly intact, motor,  sensory, and coordinative functions are intact. HEENT: Normocephalic, atraumatic, neck supple, no masses, no lymphadenopathy, thyroid  nonenlarged. Oropharynx, nasopharynx, external ear canals are unremarkable. Skin: Warm and dry, no rashes noted.  Cardiac: Regular rate and rhythm, no murmurs rubs or gallops. No peripheral edema. Pulses symmetric. Respiratory: Clear to auscultation bilaterally. Speaking in full sentences.  Abdominal: Soft, tenderness without rebound at the left upper and right upper quadrants, nondistended, hypoactive bowel sounds, no masses, no organomegaly. Musculoskeletal: Stable,  and with full range of motion.   Impression and Recommendations:   The patient was counselled, risk factors were discussed, and anticipatory guidance given.  Problem List Items Addressed This Visit     Allergic rhinitis   Allergic symptoms - Allergies well-controlled      Bronchiectasis without complication (HCC)   Symptoms currently well-controlled. - Continue to maintain routine follow-up with pulmonology      Gastroesophageal reflux disease with esophagitis without hemorrhage   Abdominal pain and gastroesophageal reflux symptoms - Significant abdominal pain localized around the ribcage, associated with eating certain foods (specific triggers unclear) - Pain described as intense, radiating throughout the abdomen, and resolves spontaneously over time - Bloating and sweating precede the onset of pain - Reflux symptoms present - Management includes omeprazole  and buscapin, sometimes requiring two doses for relief - No recent use of pantoprazole  - Completed H. pylori treatment last year without follow-up testing for eradication - No access to regular medical care for the past year  Helicobacter pylori infection, status post treatment, needs re-evaluation Previously treated for H. pylori. Importance of confirming eradication discussed. - Order H. pylori breath test.  Gastroesophageal reflux disease with epigastric and generalized abdominal pain Recurrent abdominal pain likely exacerbated by diet. Symptoms suggest gastritis. Consider IBS. Discussed H. pylori's role in acid production and need for re-evaluation. - Order H. pylori breath test. - Prescribe pantoprazole  daily on an empty stomach. - Advise dietary modifications to avoid spicy, greasy, and fried foods. - Discuss potential need for GI referral if H. pylori test is negative.      Relevant Medications   pantoprazole  (PROTONIX ) 20 MG tablet   Other Relevant Orders   H. pylori breath test   Irregular menses    Menstrual irregularity - Irregular menstrual cycles since ParaGard  IUD insertion in June - Amenorrhea since IUD placement - Upcoming gynecology appointment in September for evaluation, advised to keep visit      Other constipation   Constipation - Bowel movements occur only once or twice per week - No regular medication taken for constipation - Acknowledges constipation may contribute to abdominal pain  Irritable bowel syndrome with constipation Symptoms suggest IBS-C. Discussed dietary triggers and low FODMAP diet. - Prescribe dicyclomine  as needed for abdominal spasms. - Provide information on low FODMAP diet. - Advise on dietary modifications to identify and avoid triggers.  Constipation Infrequent bowel movements contributing to discomfort. Importance of regularity discussed. - Recommend daily fiber supplementation. - Provide list of steps to manage constipation, including potential use of fiber supplements and Miralax.      Relevant Medications   dicyclomine  (BENTYL ) 10 MG capsule   Urine malodor   Urinary symptoms - Change in urinary odor and increased urgency over the past few days - No associated pain or discharge  Bacterial vaginosis, suspected Suspected due to urinary urgency and odor.  - Perform self-swab for bacterial vaginosis. - Order urine test - Will contact with lab results and next steps as applicable.      Other Visit Diagnoses  Healthcare maintenance    -  Primary   Relevant Orders   CBC   Comprehensive metabolic panel with GFR   Hemoglobin A1c   Lipid panel   UA/M w/rflx Culture, Routine   Cervicovaginal ancillary only        Orders & Medications Medications:  Meds ordered this encounter  Medications   pantoprazole  (PROTONIX ) 20 MG tablet    Sig: Take 1 tablet (20 mg total) by mouth daily.    Dispense:  90 tablet    Refill:  3   albuterol  (VENTOLIN  HFA) 108 (90 Base) MCG/ACT inhaler    Sig: Inhale 1-2 puffs into the lungs every 6  (six) hours as needed for wheezing or shortness of breath.    Dispense:  8 g    Refill:  2   dicyclomine  (BENTYL ) 10 MG capsule    Sig: Take 1 capsule (10 mg total) by mouth 4 (four) times daily -  before meals and at bedtime. Take as-needed for abdominal pain    Dispense:  90 capsule    Refill:  0   Orders Placed This Encounter  Procedures   CBC   Comprehensive metabolic panel with GFR   Hemoglobin A1c   Lipid panel   UA/M w/rflx Culture, Routine   H. pylori breath test     No follow-ups on file.    Selinda JINNY Ku, MD, [facility]   Primary Care Sports Medicine Primary Care and Sports Medicine at [facility] [place]

## 2024-01-03 NOTE — Assessment & Plan Note (Signed)
 Constipation - Bowel movements occur only once or twice per week - No regular medication taken for constipation - Acknowledges constipation may contribute to abdominal pain  Irritable bowel syndrome with constipation Symptoms suggest IBS-C. Discussed dietary triggers and low FODMAP diet. - Prescribe dicyclomine  as needed for abdominal spasms. - Provide information on low FODMAP diet. - Advise on dietary modifications to identify and avoid triggers.  Constipation Infrequent bowel movements contributing to discomfort. Importance of regularity discussed. - Recommend daily fiber supplementation. - Provide list of steps to manage constipation, including potential use of fiber supplements and Miralax.

## 2024-01-03 NOTE — Assessment & Plan Note (Signed)
 Allergic symptoms - Allergies well-controlled

## 2024-01-03 NOTE — Patient Instructions (Addendum)
-   Obtain fasting labs with orders provided (can have water or black coffee but otherwise no food or drink x 8 hours before labs) - Review information provided - Attend eye doctor annually, dentist every 6 months, work towards or maintain 30 minutes of moderate intensity physical activity at least 5 days per week, and consume a balanced diet - Return in 1 year for physical - Contact us  for any questions between now and then   Patient Plan for Post-Visit Guidance  Allergic Rhinitis - Continue current allergy management as symptoms are well-controlled.  Bronchiectasis - Continue routine follow-up with pulmonology.  Gastroesophageal Reflux Disease and Abdominal Pain - Take pantoprazole  daily on an empty stomach as prescribed. - Avoid spicy, greasy, and fried foods. - H. pylori breath test has been ordered to confirm eradication of previous infection. - If H. pylori test is negative and symptoms persist, a referral to GI may be considered.  Irritable Bowel Syndrome with Constipation (IBS-C) and Constipation - Take dicyclomine  as needed for abdominal spasms. - Start daily fiber supplementation. - Follow provided steps to manage constipation, including the use of fiber supplements and Miralax if needed. - Review information on the low FODMAP diet and identify dietary triggers to avoid.  Irregular Menses - Keep your upcoming gynecology appointment in September for evaluation of menstrual irregularity.  Urinary Symptoms and Suspected Bacterial Vaginosis - Self-swab for bacterial vaginosis has been performed. - Urine test has been ordered. - Await contact with lab results and further instructions.  Red Flags - If you experience severe or persistent abdominal pain, vomiting blood, black or tarry stools, high fever, severe constipation, inability to urinate, or worsening urinary symptoms, seek medical attention promptly.

## 2024-01-03 NOTE — Assessment & Plan Note (Signed)
>>  ASSESSMENT AND PLAN FOR URINE MALODOR WRITTEN ON 01/03/2024 11:40 AM BY Thaily Hackworth J, MD  Urinary symptoms - Change in urinary odor and increased urgency over the past few days - No associated pain or discharge  Bacterial vaginosis, suspected Suspected due to urinary urgency and odor.  - Perform self-swab for bacterial vaginosis. - Order urine test - Will contact with lab results and next steps as applicable.

## 2024-01-04 ENCOUNTER — Other Ambulatory Visit
Admission: RE | Admit: 2024-01-04 | Discharge: 2024-01-04 | Disposition: A | Discharge status: Home / Self Care | Attending: Family Medicine | Admitting: Family Medicine

## 2024-01-04 DIAGNOSIS — R7401 Elevation of levels of liver transaminase levels: Secondary | ICD-10-CM | POA: Insufficient documentation

## 2024-01-04 DIAGNOSIS — R1084 Generalized abdominal pain: Secondary | ICD-10-CM | POA: Insufficient documentation

## 2024-01-04 LAB — COMPREHENSIVE METABOLIC PANEL WITH GFR
ALT: 451 U/L — ABNORMAL HIGH (ref 0–44)
AST: 266 U/L — ABNORMAL HIGH (ref 15–41)
Albumin: 4.2 g/dL (ref 3.5–5.0)
Alkaline Phosphatase: 176 U/L — ABNORMAL HIGH (ref 38–126)
Anion gap: 10 (ref 5–15)
BUN: 11 mg/dL (ref 6–20)
CO2: 26 mmol/L (ref 22–32)
Calcium: 9.5 mg/dL (ref 8.9–10.3)
Chloride: 103 mmol/L (ref 98–111)
Creatinine, Ser: 0.7 mg/dL (ref 0.44–1.00)
GFR, Estimated: >60 mL/min (ref >60)
Glucose, Bld: 92 mg/dL (ref 70–99)
Potassium: 3.9 mmol/L (ref 3.5–5.1)
Sodium: 139 mmol/L (ref 135–145)
Total Bilirubin: 1.1 mg/dL (ref 0.0–1.2)
Total Protein: 8 g/dL (ref 6.5–8.1)

## 2024-01-04 LAB — AMYLASE: Amylase: 92 U/L (ref 28–100)

## 2024-01-04 LAB — H. PYLORI BREATH TEST: H pylori Breath Test: NEGATIVE

## 2024-01-04 LAB — CERVICOVAGINAL ANCILLARY ONLY
Bacterial Vaginitis (gardnerella): NEGATIVE
Candida Glabrata: NEGATIVE
Candida Vaginitis: NEGATIVE
Chlamydia: NEGATIVE
Comment: NEGATIVE
Comment: NEGATIVE
Comment: NEGATIVE
Comment: NEGATIVE
Comment: NEGATIVE
Comment: NORMAL
Neisseria Gonorrhea: NEGATIVE
Trichomonas: NEGATIVE

## 2024-01-04 LAB — HEPATITIS C ANTIBODY: HCV Ab: NONREACTIVE

## 2024-01-04 LAB — BILIRUBIN, DIRECT: Bilirubin, Direct: 0.1 mg/dL (ref 0.0–0.2)

## 2024-01-04 LAB — HEMOGLOBIN A1C
Hgb A1c MFr Bld: 5.4 % (ref 4.8–5.6)
Mean Plasma Glucose: 108 mg/dL

## 2024-01-04 LAB — GAMMA GT: GGT: 279 U/L — ABNORMAL HIGH (ref 7–50)

## 2024-01-04 LAB — LIPASE, BLOOD: Lipase: 41 U/L (ref 11–51)

## 2024-01-04 LAB — HEPATITIS B CORE ANTIBODY, IGM: Hep B C IgM: NONREACTIVE

## 2024-01-04 LAB — HEPATITIS B SURFACE ANTIGEN: Hepatitis B Surface Ag: NONREACTIVE

## 2024-01-04 LAB — HEPATITIS A ANTIBODY, IGM: Hep A IgM: NONREACTIVE

## 2024-01-05 ENCOUNTER — Encounter: Payer: Self-pay | Admitting: Family Medicine

## 2024-01-05 LAB — URINE CULTURE: Culture: >=100000 — AB

## 2024-01-05 LAB — HEPATITIS B SURFACE ANTIBODY, QUANTITATIVE: Hep B S AB Quant (Post): <3.5 m[IU]/mL — ABNORMAL LOW

## 2024-01-05 LAB — HEPATITIS B CORE ANTIBODY, TOTAL: HEP B CORE AB: NEGATIVE

## 2024-01-05 NOTE — Telephone Encounter (Signed)
 Please review and advise patient.   JM

## 2024-01-09 ENCOUNTER — Ambulatory Visit: Payer: Self-pay | Admitting: Family Medicine

## 2024-01-09 DIAGNOSIS — R7401 Elevation of levels of liver transaminase levels: Secondary | ICD-10-CM

## 2024-01-10 ENCOUNTER — Ambulatory Visit
Admission: RE | Admit: 2024-01-10 | Discharge: 2024-01-10 | Disposition: A | Discharge status: Home / Self Care | Source: Ambulatory Visit | Attending: Family Medicine | Admitting: Family Medicine

## 2024-01-10 DIAGNOSIS — R7401 Elevation of levels of liver transaminase levels: Secondary | ICD-10-CM | POA: Diagnosis present

## 2024-01-10 DIAGNOSIS — R1084 Generalized abdominal pain: Secondary | ICD-10-CM | POA: Diagnosis present

## 2024-01-10 NOTE — Telephone Encounter (Signed)
 Patient being referred for elevated liver enzymes 10 x ULN, trending down  Please schedule patient for please schedule for urgent appointment on Thursday 01/19/2024 at 2:15  Referring: Alvia Selinda PARAS, MD [address] [place] [address] Phone: [phone] Fax: [phone]

## 2024-01-11 NOTE — Telephone Encounter (Signed)
 NP appt scheduled for 01/19/2024 at 2:15 PM with a 1:45 PM arrival time. Confirmed address. NP packet mailed. Informed referring.

## 2024-01-13 ENCOUNTER — Encounter: Payer: Self-pay | Admitting: Family Medicine

## 2024-01-13 NOTE — Telephone Encounter (Signed)
 Please review. JM

## 2024-02-01 ENCOUNTER — Emergency Department

## 2024-02-01 ENCOUNTER — Other Ambulatory Visit: Payer: Self-pay

## 2024-02-01 ENCOUNTER — Emergency Department
Admission: EM | Admit: 2024-02-01 | Discharge: 2024-02-01 | Disposition: A | Discharge status: Home / Self Care | Attending: Emergency Medicine | Admitting: Emergency Medicine

## 2024-02-01 DIAGNOSIS — R1012 Left upper quadrant pain: Secondary | ICD-10-CM | POA: Diagnosis not present

## 2024-02-01 DIAGNOSIS — D72829 Elevated white blood cell count, unspecified: Secondary | ICD-10-CM | POA: Insufficient documentation

## 2024-02-01 DIAGNOSIS — J45909 Unspecified asthma, uncomplicated: Secondary | ICD-10-CM | POA: Insufficient documentation

## 2024-02-01 DIAGNOSIS — R1011 Right upper quadrant pain: Secondary | ICD-10-CM | POA: Insufficient documentation

## 2024-02-01 DIAGNOSIS — R7989 Other specified abnormal findings of blood chemistry: Secondary | ICD-10-CM | POA: Diagnosis not present

## 2024-02-01 DIAGNOSIS — R748 Abnormal levels of other serum enzymes: Secondary | ICD-10-CM

## 2024-02-01 DIAGNOSIS — R1010 Upper abdominal pain, unspecified: Secondary | ICD-10-CM

## 2024-02-01 LAB — CBC
HCT: 43 % (ref 36.0–46.0)
Hemoglobin: 14.5 g/dL (ref 12.0–15.0)
MCH: 30 pg (ref 26.0–34.0)
MCHC: 33.7 g/dL (ref 30.0–36.0)
MCV: 89 fL (ref 80.0–100.0)
Platelets: 273 K/uL (ref 150–400)
RBC: 4.83 MIL/uL (ref 3.87–5.11)
RDW: 13.9 % (ref 11.5–15.5)
WBC: 11.2 K/uL — ABNORMAL HIGH (ref 4.0–10.5)
nRBC: 0 % (ref 0.0–0.2)

## 2024-02-01 LAB — COMPREHENSIVE METABOLIC PANEL WITH GFR
ALT: 146 U/L — ABNORMAL HIGH (ref 0–44)
AST: 134 U/L — ABNORMAL HIGH (ref 15–41)
Albumin: 4.2 g/dL (ref 3.5–5.0)
Alkaline Phosphatase: 207 U/L — ABNORMAL HIGH (ref 38–126)
Anion gap: 11 (ref 5–15)
BUN: 17 mg/dL (ref 6–20)
CO2: 25 mmol/L (ref 22–32)
Calcium: 9.3 mg/dL (ref 8.9–10.3)
Chloride: 104 mmol/L (ref 98–111)
Creatinine, Ser: 0.74 mg/dL (ref 0.44–1.00)
GFR, Estimated: >60 mL/min (ref >60)
Glucose, Bld: 97 mg/dL (ref 70–99)
Potassium: 3.8 mmol/L (ref 3.5–5.1)
Sodium: 140 mmol/L (ref 135–145)
Total Bilirubin: 1.2 mg/dL (ref 0.0–1.2)
Total Protein: 8.2 g/dL — ABNORMAL HIGH (ref 6.5–8.1)

## 2024-02-01 LAB — LIPASE, BLOOD: Lipase: 57 U/L — ABNORMAL HIGH (ref 11–51)

## 2024-02-01 LAB — URINALYSIS, ROUTINE W REFLEX MICROSCOPIC
Bilirubin Urine: NEGATIVE
Glucose, UA: NEGATIVE mg/dL
Hgb urine dipstick: NEGATIVE
Ketones, ur: NEGATIVE mg/dL
Leukocytes,Ua: NEGATIVE
Nitrite: NEGATIVE
Protein, ur: NEGATIVE mg/dL
Specific Gravity, Urine: 1.013 (ref 1.005–1.030)
pH: 6 (ref 5.0–8.0)

## 2024-02-01 LAB — POC URINE PREG, ED: Preg Test, Ur: NEGATIVE

## 2024-02-01 MED ORDER — FENTANYL CITRATE PF 50 MCG/ML IJ SOSY
50.0000 ug | PREFILLED_SYRINGE | Freq: Once | INTRAMUSCULAR | Status: AC
  Administered 2024-02-01: 50 ug via INTRAVENOUS
  Filled 2024-02-01: qty 1

## 2024-02-01 MED ORDER — OXYCODONE HCL 5 MG PO TABS: 5.0000 mg | ORAL_TABLET | Freq: Three times a day (TID) | ORAL | 0 refills | Status: AC | PRN

## 2024-02-01 MED ORDER — ONDANSETRON HCL 4 MG/2ML IJ SOLN
4.0000 mg | Freq: Once | INTRAMUSCULAR | Status: AC
  Administered 2024-02-01: 4 mg via INTRAVENOUS
  Filled 2024-02-01: qty 2

## 2024-02-01 NOTE — Discharge Instructions (Signed)
 Please follow-up with GI.  Call tomorrow and schedule appointment tomorrow with Dr. Aundria for further evaluation.

## 2024-02-01 NOTE — ED Provider Notes (Signed)
 [facility] Emergency Department Provider Note     Event Date/Time   First MD Initiated Contact with Patient 02/01/24 1829     (approximate)   History   Abdominal Pain   HPI  Jeanette Sandoval is a 38 y.o. female with a past medical history of asthma, GERD, bronchiectasis presents to the ED for evaluation of upper abdomen pain bilaterally.  Patient reports pain is localized to her inferior rib cage and radiates around her entire rib cage.  Intermittent in nature.  Patient reports it comes in waves.  She denies chest pain or shortness of breath.  She was evaluated by her PCP who prescribed Bentyl , however she reports not effective.  She denies nausea, vomiting, diarrhea, fever, sick contacts.  Patient reports she has had this pain before but unable to control pain at home.  She denies injury.  Denies chest pain or shortness of breath.     Physical Exam   Triage Vital Signs: ED Triage Vitals  Encounter Vitals Group     BP 02/01/24 1608 98/70     Girls Systolic BP Percentile --      Girls Diastolic BP Percentile --      Boys Systolic BP Percentile --      Boys Diastolic BP Percentile --      Pulse Rate 02/01/24 1608 64     Resp 02/01/24 1608 18     Temp 02/01/24 1608 97.8 F (36.6 C)     Temp Source 02/01/24 1608 Oral     SpO2 02/01/24 1608 100 %     Weight 02/01/24 1611 148 lb (67.1 kg)     Height 02/01/24 1611 5' 5 (1.651 m)     Head Circumference --      Peak Flow --      Pain Score 02/01/24 1609 9     Pain Loc --      Pain Education --      Exclude from Growth Chart --     Most recent vital signs: Vitals:   02/01/24 1944 02/01/24 2000  BP: (!) 98/59 96/63  Pulse: (!) 51 (!) 55  Resp: 18   Temp: 97.6 F (36.4 C)   SpO2: 98% 99%    General Tearful awake, no distress.  HEENT NCAT. PERRL. EOMI. No rhinorrhea. Mucous membranes are moist.  CV:  Good peripheral perfusion.  RRR RESP:  Normal effort.  LCTAB ABD:  No distention.  Soft,  nontender with palpation in all quadrants. Other:  Tenderness to palpation over entirety of rib 7 bilaterally.    ED Results / Procedures / Treatments   Labs (all labs ordered are listed, but only abnormal results are displayed) Labs Reviewed  LIPASE, BLOOD - Abnormal; Notable for the following components:      Result Value   Lipase 57 (*)    All other components within normal limits  COMPREHENSIVE METABOLIC PANEL WITH GFR - Abnormal; Notable for the following components:   Total Protein 8.2 (*)    AST 134 (*)    ALT 146 (*)    Alkaline Phosphatase 207 (*)    All other components within normal limits  CBC - Abnormal; Notable for the following components:   WBC 11.2 (*)    All other components within normal limits  URINALYSIS, ROUTINE W REFLEX MICROSCOPIC - Abnormal; Notable for the following components:   Color, Urine YELLOW (*)    APPearance CLEAR (*)    All other components within normal  limits  POC URINE PREG, ED    RADIOLOGY  I personally viewed and evaluated these images as part of my medical decision making, as well as reviewing the written report by the radiologist.  ED Provider Interpretation: No acute abnormality on chest x-ray.  DG Chest 2 View Result Date: 02/01/2024 CLINICAL DATA:  Rib cage pain EXAM: CHEST - 2 VIEW COMPARISON:  05/30/2022, chest CT 05/30/2022 FINDINGS: Bronchiectasis and chronic lingular and right middle lobe opacities. Stable cardiomediastinal silhouette. Normal cardiac size. No pneumothorax IMPRESSION: Bronchiectasis with chronic lingular and right middle lobe opacities. No acute airspace disease. Electronically Signed   By: Luke Bun M.D.   On: 02/01/2024 19:11    PROCEDURES:  Critical Care performed: No  Procedures   MEDICATIONS ORDERED IN ED: Medications  fentaNYL  (SUBLIMAZE ) injection 50 mcg (50 mcg Intravenous Given 02/01/24 1940)  ondansetron  (ZOFRAN ) injection 4 mg (4 mg Intravenous Given 02/01/24 1940)     IMPRESSION / MDM  / ASSESSMENT AND PLAN / ED COURSE  I reviewed the triage vital signs and the nursing notes.                              Clinical Course as of 02/01/24 2341  Wed Feb 01, 2024  1930 DG Chest 2 View IMPRESSION: Bronchiectasis with chronic lingular and right middle lobe opacities. No acute airspace disease.     [MH]  2335 CBC(!) Mild leukocytosis [MH]  2335 Comprehensive metabolic panel(!) Elevated liver enzymes.  Improvement from 4 weeks ago. [MH]  2335 Lipase, blood(!) Mildly elevated [MH]    Clinical Course User Index [MH] Margrette, Christerpher Clos A, PA-C    38 y.o. female presents to the emergency department for evaluation and treatment of rib cage pain. See HPI for further details.   Differential diagnosis includes, but is not limited to, strain, biliary disease, gastritis, PNA  pancreatitis   Patient's presentation is most consistent with acute complicated illness / injury requiring diagnostic workup.  Shared visit with supervising attending the Dr. Arlander who also assessed patient.  There was no indication for imaging at this time.  Chart reviewed.  Patient had ultrasound and CT abdomen pelvis 08/19 and 08/12 with no abnormality.  She is well-appearing, however tearful on physical exam.  Lab work pertinent for elevated liver enzymes which compared to her trend has improved significantly in the past month.  Urinalysis is normal.  Chest x-ray reassuring.  Pain management with fentanyl  and Zofran .  Will have patient follow-up with GI for further evaluation given liver enzymes.  Short course of pain medication sent to pharmacy.  Patient stable for discharge.  ED return precaution discussed.  FINAL CLINICAL IMPRESSION(S) / ED DIAGNOSES   Final diagnoses:  Pain of upper abdomen  Elevated liver enzymes   Rx / DC Orders   ED Discharge Orders          Ordered    oxyCODONE  (ROXICODONE ) 5 MG immediate release tablet  Every 8 hours PRN        02/01/24 2027           Note:  This  document was prepared using Dragon voice recognition software and may include unintentional dictation errors.    Margrette, Hykeem Ojeda A, PA-C 02/01/24 2341    Arlander [place], MD 02/03/24 [phone]

## 2024-02-01 NOTE — ED Notes (Signed)
 ED Provider at bedside.

## 2024-02-01 NOTE — ED Triage Notes (Addendum)
 Pt arrives via POV with c/o rib cage pain that is in the upper abdomen area on both sides. Pt was recently prescribed bentyl  from PCP and pt took a dose at 1300 but the pain has lasted for more than 3 hours. Pt denies N/V/D, cramping, constipation, fever. Pt has felt pain like this before but not this intense or for this long. Pt is A&Ox4 and ambulatory during triage.

## 2024-02-02 ENCOUNTER — Encounter: Payer: Self-pay | Admitting: Obstetrics and Gynecology

## 2024-02-02 ENCOUNTER — Encounter: Payer: Self-pay | Admitting: Family Medicine

## 2024-02-02 ENCOUNTER — Ambulatory Visit: Admitting: Obstetrics and Gynecology

## 2024-02-02 VITALS — BP 89/54 | HR 67 | Ht 65.0 in | Wt 145.0 lb

## 2024-02-02 DIAGNOSIS — Z30431 Encounter for routine checking of intrauterine contraceptive device: Secondary | ICD-10-CM

## 2024-02-02 DIAGNOSIS — N390 Urinary tract infection, site not specified: Secondary | ICD-10-CM | POA: Diagnosis not present

## 2024-02-02 MED ORDER — SULFAMETHOXAZOLE-TRIMETHOPRIM 400-80 MG PO TABS
1.0000 | ORAL_TABLET | Freq: Once | ORAL | 1 refills | Status: AC
Start: 2024-02-02 — End: 2024-02-02

## 2024-02-02 NOTE — Progress Notes (Signed)
   Chief Complaint  Patient presents with   IUD Check    No concerns     History of Present Illness:  Jeanette Sandoval is a 38 y.o. that had a Paragard  IUD placed 11/17/23. Since that time, she denies vag sx, pelvic pain, heavy bleeding. Menses are Q1-3 months, lasting up to 6 days anyways. LMP was 7 days, mod flow, mild dysmen, no BTB.  She is sexually active, has had some pelvic pains in certain positions but not all the time; new sx for pt, not related to UTIs. Hx of recurrent UTIs with pos cultures, thinks sex is trigger. Wiping front to back, drinking some water, voiding before and after sex.  Neg UPT in ED yesterday  Review of Systems  Constitutional:  Negative for fever.  Gastrointestinal:  Negative for blood in stool, constipation, diarrhea, nausea and vomiting.  Genitourinary:  Negative for dyspareunia, dysuria, flank pain, frequency, hematuria, urgency, vaginal bleeding, vaginal discharge and vaginal pain.  Musculoskeletal:  Negative for back pain.  Skin:  Negative for rash.    Physical Exam:  BP (!) 89/54   Pulse 67   Ht 5' 5 (1.651 m)   Wt 145 lb (65.8 kg)   LMP 01/19/2024 (Exact Date)   BMI 24.13 kg/m  Body mass index is 24.13 kg/m.  Pelvic exam:  Two IUD strings present seen coming from the cervical os. EGBUS, vaginal vault and cervix: within normal limits   Assessment:   Encounter for routine checking of intrauterine contraceptive device (IUD)--IUD strings present in proper location; pt doing well. F/u prn dyspareunia.  Postcoital UTI - Plan: sulfamethoxazole -trimethoprim  (BACTRIM ) 400-80 MG tablet; Rx bactrim  to take 1 after sex as preventive; increase water, can do UTI prevention supplements by [facility]. F/u prn.    Plan: F/u if any signs of infection or can no longer feel the strings.   Quintara Bost B. Tim Wilhide, PA-C 02/02/2024 4:01 PM

## 2024-02-02 NOTE — Patient Instructions (Signed)
 I value your feedback and you entrusting [place] with your care. If you get a [place] patient survey, I would appreciate you taking the time to let us know about your experience today. Thank you! ? ? ?

## 2024-02-02 NOTE — Telephone Encounter (Signed)
@  Front desk-Please call pt to schedule a hospital follow up.  Please review @Jason  Alvia.  @ Referrals team Please see if the pt can be seen sooner with GI.

## 2024-02-02 NOTE — Telephone Encounter (Signed)
Patient will call back to schedule hospital follow up

## 2024-02-06 ENCOUNTER — Ambulatory Visit: Admitting: Pulmonary Disease

## 2024-02-07 ENCOUNTER — Encounter: Payer: Self-pay | Admitting: Family Medicine

## 2024-02-07 ENCOUNTER — Ambulatory Visit: Admitting: Family Medicine

## 2024-02-07 VITALS — BP 90/60 | HR 76 | Ht 65.0 in | Wt 147.0 lb

## 2024-02-07 DIAGNOSIS — R1084 Generalized abdominal pain: Secondary | ICD-10-CM | POA: Insufficient documentation

## 2024-02-07 DIAGNOSIS — R7401 Elevation of levels of liver transaminase levels: Secondary | ICD-10-CM | POA: Diagnosis not present

## 2024-02-07 DIAGNOSIS — K5909 Other constipation: Secondary | ICD-10-CM

## 2024-02-07 NOTE — Assessment & Plan Note (Signed)
 Altered bowel habits - History of cholecystectomy - Irregular bowel movements, not occurring daily - Daily use of Miralax previously resulted in regular bowel movements but did not alleviate abdominal pain - Elimination of spicy and greasy foods from diet without improvement in pain  Constipation Chronic constipation with infrequent bowel movements. Previous Miralax use resulted in daily bowel movements but did not alleviate abdominal pain. Constipation may contribute to abdominal pain, especially if exacerbated by adhesions or scar tissue from previous surgery. - Restart Miralax and add stool softener to ensure daily bowel movements. - Monitor bowel movement frequency and consistency. - Adjust Miralax and stool softener dosage as needed to maintain regularity.

## 2024-02-07 NOTE — Assessment & Plan Note (Signed)
 Jeanette Sandoval is a 38 year old female who presents for follow-up after an ER visit for severe abdominal pain and elevated liver enzymes.  Severe abdominal pain - Severe abdominal pain led to an ER visit on September 10th - Pain episodes typically last 30 minutes to 2 hours, but persisted for five hours during the recent episode - Pain often occurs at night but can also occur during the day - Pain intensity escalates from zero to ten within three minutes - Previous medications for abdominal spasms have not provided relief  Recurrent severe abdominal pain Recurrent severe abdominal pain, often nocturnal, with rapid escalation to severe intensity. Pain is unpredictable and not consistently related to food intake. Previous antispasmodics were ineffective. Differential diagnosis includes gastrointestinal issues, potential adhesions from previous surgery, and constipation-related pain. Opioid use considered but may exacerbate constipation. - Start Miralax and stool softener to ensure daily bowel movements. - Use prescribed opioid analgesics during severe pain episodes. - Avoid spicy and greasy foods. - Track correlation between pain and food intake.

## 2024-02-07 NOTE — Patient Instructions (Addendum)
 Patient Plan  Severe Abdominal Pain and Constipation  - Start Miralax and a stool softener to ensure daily bowel movements. - Use prescribed opioid pain medication only during severe pain episodes. - Avoid spicy and greasy foods. - Track any correlation between pain episodes and food intake. - Monitor bowel movement frequency and consistency; adjust Miralax and stool softener as needed to maintain regularity.  Elevated Liver Enzymes (Transaminitis)  - Urgent referral placed for evaluation by a GI specialist. - Coordinate follow-up with hepatologist Dr. Hays regarding recent ER visit and current symptoms.  Red flags - seek care if you notice:  - Severe or persistent abdominal pain not relieved by medication - Vomiting blood or blood in stool - Yellowing of the skin or eyes (jaundice) - High fever, chills, or confusion - New or worsening symptoms that concern you

## 2024-02-07 NOTE — Assessment & Plan Note (Signed)
 Elevated hepatic transaminases - History of elevated liver enzymes with AST peaking at 871 on August 12th and decreasing to 134 by September 10th - ALT decreased from 783 to 146 over the same period - Weakly positive actin smooth muscle antibody test - Evaluation by a liver specialist who indicated the pain was not thought to be liver-related - Autoimmune hepatitis type 1 suggested as a possible diagnosis by her doctor in [place]  Results LABS AST: 871 (01/03/2024) AST: 266 (01/04/2024) AST: 134 (02/01/2024) ALT: 783 (01/03/2024) ALT: 451 (01/04/2024) ALT: 146 (02/01/2024) ASMA: 23  Elevated liver enzymes with suspected autoimmune hepatitis Elevated liver enzymes with a trend of gradual decrease. 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 Previous viral hepatitis ruled out. Liver function is improving, but the cause of enzyme elevation remains unclear. Autoimmune hepatitis is a presumptive diagnosis, requiring further evaluation by a hepatologist or GI specialist. - Place urgent referral for GI specialist within the [facility] system. - Send MyChart message to hepatologist Dr. Hays to update on ER visit and current symptoms. - Discuss potential autoimmune hepatitis with GI specialist. - Monitor liver enzyme trends.

## 2024-02-07 NOTE — Progress Notes (Signed)
 Primary Care / Sports Medicine Office Visit  Patient Information:  Patient ID: Jeanette Sandoval, female DOB: 1986-02-05 Age: 38 y.o. MRN: [id number]   Jeanette Sandoval is a pleasant 38 y.o. female presenting with the following:  Chief Complaint  Patient presents with   Hospitalization Follow-up    Patent following up from hospital visit on 02/01/24. She went to ED for pain in right upper quad of abdomen. Patient stas it is right rib pain not abdominal pain. She is okay today as the pain has not came back since visit to ED.The ED doctor gave her some Oxycodone  for when pain returns.    Vitals:   02/07/24 1549  BP: 90/60  Pulse: 76  SpO2: 99%   Vitals:   02/07/24 1549  Weight: 147 lb (66.7 kg)  Height: 5' 5 (1.651 m)   Body mass index is 24.46 kg/m.  DG Chest 2 View Result Date: 02/01/2024 CLINICAL DATA:  Rib cage pain EXAM: CHEST - 2 VIEW COMPARISON:  05/30/2022, chest CT 05/30/2022 FINDINGS: Bronchiectasis and chronic lingular and right middle lobe opacities. Stable cardiomediastinal silhouette. Normal cardiac size. No pneumothorax IMPRESSION: Bronchiectasis with chronic lingular and right middle lobe opacities. No acute airspace disease. Electronically Signed   By: Luke Bun M.D.   On: 02/01/2024 19:11   US  ABDOMEN LIMITED WITH LIVER DOPPLER Result Date: 01/18/2024 CLINICAL DATA:  Abdominal pain.  Transaminitis. EXAM: DUPLEX ULTRASOUND OF LIVER TECHNIQUE: Color and duplex Doppler ultrasound was performed to evaluate the hepatic in-flow and out-flow vessels. COMPARISON:  CT abdomen pelvis 01/03/2024 FINDINGS: Liver: Normal parenchymal echogenicity. Normal hepatic contour without nodularity. No focal lesion, mass or intrahepatic biliary ductal dilatation. Gallbladder: Status post cholecystectomy Common bile duct diameter: 0.3 cm Main Portal Vein size: 0.7 cm cm Portal Vein Velocities Main Prox:  37 cm/sec Main Mid: 38 cm/sec Main Dist:  38 cm/sec Right: 30 cm/sec Left: 24 cm/sec  Hepatic Vein Velocities Right:  35 cm/sec Middle:  43 cm/sec Left:  31 cm/sec IVC: Present and patent with normal respiratory phasicity. Hepatic Artery Velocity:  85 cm/sec Splenic Vein Velocity:  16.2 cm/sec Spleen: 11.5 cm x 11.3 cm x 4.1 cm with a total volume of 280 cm^3 (411 cm^3 is upper limit normal) Portal Vein Occlusion/Thrombus: No Splenic Vein Occlusion/Thrombus: No Ascites: None Varices: None IMPRESSION: 1. No sonographic abnormality of the liver. 2. Patent portal vein with appropriate direction of flow. Electronically Signed   By: Aliene Lloyd M.D.   On: 01/18/2024 12:41     Discussed the use of AI scribe software for clinical note transcription with the patient, who gave verbal consent to proceed.   Independent interpretation of notes and tests performed by another provider:   None  Procedures performed:   None  Pertinent History, Exam, Impression, and Recommendations:   Problem List Items Addressed This Visit     Generalized abdominal pain   Jeanette Sandoval is a 38 year old female who presents for follow-up after an ER visit for severe abdominal pain and elevated liver enzymes.  Severe abdominal pain - Severe abdominal pain led to an ER visit on September 10th - Pain episodes typically last 30 minutes to 2 hours, but persisted for five hours during the recent episode - Pain often occurs at night but can also occur during the day - Pain intensity escalates from zero to ten within three minutes - Previous medications for abdominal spasms have not provided relief  Recurrent severe abdominal pain Recurrent severe abdominal pain,  often nocturnal, with rapid escalation to severe intensity. Pain is unpredictable and not consistently related to food intake. Previous antispasmodics were ineffective. Differential diagnosis includes gastrointestinal issues, potential adhesions from previous surgery, and constipation-related pain. Opioid use considered but may exacerbate  constipation. - Start Miralax and stool softener to ensure daily bowel movements. - Use prescribed opioid analgesics during severe pain episodes. - Avoid spicy and greasy foods. - Track correlation between pain and food intake.      Relevant Orders   Ambulatory referral to Gastroenterology   Other constipation   Altered bowel habits - History of cholecystectomy - Irregular bowel movements, not occurring daily - Daily use of Miralax previously resulted in regular bowel movements but did not alleviate abdominal pain - Elimination of spicy and greasy foods from diet without improvement in pain  Constipation Chronic constipation with infrequent bowel movements. Previous Miralax use resulted in daily bowel movements but did not alleviate abdominal pain. Constipation may contribute to abdominal pain, especially if exacerbated by adhesions or scar tissue from previous surgery. - Restart Miralax and add stool softener to ensure daily bowel movements. - Monitor bowel movement frequency and consistency. - Adjust Miralax and stool softener dosage as needed to maintain regularity.      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  Previous viral hepatitis ruled out. Liver function is improving, but the cause of enzyme elevation remains unclear. Autoimmune hepatitis is a presumptive diagnosis, requiring further evaluation by a hepatologist or GI specialist. - Place urgent referral for GI specialist within the [facility] system. - Send MyChart message to hepatologist Dr. Hays to update on ER visit and current symptoms. - Discuss potential autoimmune hepatitis with GI specialist. - Monitor liver enzyme trends.      Relevant Orders   Ambulatory referral to Gastroenterology     Orders & Medications Medications: No orders of the defined types were placed in this encounter.  Orders Placed This Encounter  Procedures   Ambulatory referral to Gastroenterology     No follow-ups on file.     Jeanette JINNY Ku, MD, [facility]   Primary Care Sports Medicine Primary Care and Sports Medicine at [facility]

## 2024-02-08 ENCOUNTER — Encounter: Payer: Self-pay | Admitting: Physician Assistant

## 2024-02-13 ENCOUNTER — Ambulatory Visit: Admitting: Pulmonary Disease

## 2024-02-17 ENCOUNTER — Encounter: Payer: Self-pay | Admitting: Family Medicine

## 2024-02-20 NOTE — Telephone Encounter (Signed)
 Please review and advise.  JM

## 2024-03-06 NOTE — Progress Notes (Deleted)
    NURSE VISIT NOTE  Subjective:    Patient ID: Laurana Magistro, female    DOB: April 30, 1986, 38 y.o.   MRN: [id number]  HPI  Patient is a 38 y.o. [id number] female Married {Race/ethnicity:17218} female who presents for her third Gardasil injection. Order to administer given by Damien Parsley, CNM on 03/07/24.   Objective:    There were no vitals taken for this visit.  38 y.o. LMP:  ***  Contraception:  Paragard  Given by: Mathis Getting, CMA Site:  {left/right:311354} deltoid  Lab Review  No results found for any visits on 03/07/24.    Assessment:   1. Need for HPV vaccine      Plan:   Patient will return in prn. Gardasil series complete.    Mathis LITTIE Getting, CMA

## 2024-03-06 NOTE — Patient Instructions (Incomplete)
Human Papillomavirus (HPV) Vaccine Injection What is this medication? HUMAN PAPILLOMAVIRUS VACCINE (HYOO muhn pap uh LOH muh vahy ruhs vak SEEN) reduces the risk of human papillomavirus (HPV). It does not treat HPV. It is still possible to get HPV after receiving this vaccine, but the symptoms may be less severe or not last as long. It works by helping your immune system learn how to fight off a future infection. This medicine may be used for other purposes; ask your health care provider or pharmacist if you have questions. COMMON BRAND NAME(S): Gardasil 9 What should I tell my care team before I take this medication? They need to know if you have any of these conditions: Fever Hemophilia HIV or AIDS Immune system problems Infection Low platelets An unusual reaction to human papillomavirus vaccine, yeast, other vaccines, other medications, foods, dyes, or preservatives Pregnant or trying to get pregnant Breastfeeding How should I use this medication? This vaccine is injected into a muscle. It is given by your care team. This vaccine requires 2 or 3 doses to get the full benefit. Set a reminder for when your next dose is due. A copy of the Vaccine Information Statement will be given before each vaccination. Be sure to read this information carefully each time. This sheet may change often. Talk to your care team about the use of this medication in children. While it may be prescribed for children as young as 9 years for selected conditions, precautions do apply. Overdosage: If you think you have taken too much of this medicine contact a poison control center or emergency room at once. NOTE: This medicine is only for you. Do not share this medicine with others. What if I miss a dose? Keep appointments for follow-up doses as directed. It is important not to miss your dose. Call your care team if you are unable to keep an appointment. What may interact with this medication? Certain medications  for arthritis Medications for organ transplant Medications to treat cancer Steroid medications, such as prednisone or cortisone This list may not describe all possible interactions. Give your health care provider a list of all the medicines, herbs, non-prescription drugs, or dietary supplements you use. Also tell them if you smoke, drink alcohol, or use illegal drugs. Some items may interact with your medicine. What should I watch for while using this medication? Visit your care team regularly. Report any side effects to your care team right away. This vaccine, like all vaccines, may not fully protect everyone. What side effects may I notice from receiving this medication? Side effects that you should report to your care team as soon as possible: Allergic reactions--skin rash, itching, hives, swelling of the face, lips, tongue, or throat Feeling faint or lightheaded Side effects that usually do not require medical attention (report these to your care team if they continue or are bothersome): Diarrhea Dizziness Fatigue Fever Headache Nausea Pain, redness, irritation, or bruising at the injection site This list may not describe all possible side effects. Call your doctor for medical advice about side effects. You may report side effects to FDA at 1-800-FDA-1088. Where should I keep my medication? This vaccine is only given by your care team. It will not be stored at home. NOTE: This sheet is a summary. It may not cover all possible information. If you have questions about this medicine, talk to your doctor, pharmacist, or health care provider.  2024 Elsevier/Gold Standard (2021-10-21 00:00:00)

## 2024-03-07 ENCOUNTER — Ambulatory Visit

## 2024-03-07 DIAGNOSIS — Z23 Encounter for immunization: Secondary | ICD-10-CM

## 2024-03-09 ENCOUNTER — Ambulatory Visit: Admitting: Family Medicine

## 2024-03-09 ENCOUNTER — Encounter: Payer: Self-pay | Admitting: Family Medicine

## 2024-03-09 VITALS — BP 90/60 | HR 90 | Ht 65.0 in | Wt 148.0 lb

## 2024-03-09 DIAGNOSIS — M62838 Other muscle spasm: Secondary | ICD-10-CM | POA: Insufficient documentation

## 2024-03-09 DIAGNOSIS — R7401 Elevation of levels of liver transaminase levels: Secondary | ICD-10-CM | POA: Diagnosis not present

## 2024-03-09 DIAGNOSIS — R300 Dysuria: Secondary | ICD-10-CM | POA: Diagnosis not present

## 2024-03-09 DIAGNOSIS — J00 Acute nasopharyngitis [common cold]: Secondary | ICD-10-CM | POA: Diagnosis not present

## 2024-03-09 LAB — POCT RAPID STREP A: Strep A Ag: NEGATIVE

## 2024-03-09 MED ORDER — LIDOCAINE VISCOUS HCL 2 % MT SOLN: 15.0000 mL | OROMUCOSAL | 0 refills | Status: AC | PRN

## 2024-03-09 MED ORDER — METHOCARBAMOL 500 MG PO TABS: 500.0000 mg | ORAL_TABLET | Freq: Three times a day (TID) | ORAL | 0 refills | Status: AC | PRN

## 2024-03-09 NOTE — Assessment & Plan Note (Signed)
 Pharyngodynia (throat pain) - Throat pain for 4 days, predominantly on the left side - Pain worsened by swallowing and consumption of sour or salty foods - Described as a burning sensation radiating down the throat - Ibuprofen  400 mg every 8 to 10 hours reduces severity but does not eliminate pain - Saltwater gargles and chamomile tea used for symptomatic relief  Otolaryngologic and associated symptoms - Neck soreness and ear pain present - No tingling or numbness in the arms - Itchy eyes for a shorter duration than throat pain - No significant increase in nasal congestion beyond baseline  Constitutional symptoms - Chills and possible fever two nights ago, with sensation of cold followed by sweating after medication  Physical Exam HEENT: Oropharynx with erythema and mild swelling, no exudates. Nasopharynx with mild cobblestone pattern, bilateral turbinate erythema and swelling. Tympanic membranes and canals benign bilaterally. NECK: Prominent submandibular lymph nodes, tenderness left greater than right, shotty posterior chain cervical lymphadenopathy bilaterally. CHEST: Lungs clear to auscultation bilaterally, no wheezes, rales, or rhonchi.  Acute nasopharyngitis with lymphadenopathy Symptoms most consistent with viral etiology. Negative strep and COVID tests. - Prescribe liquid lidocaine  for throat pain. - Recommend saltwater gargles. - Advise intranasal steroid and oral antihistamine for nasal symptoms. - Instruct to monitor symptoms and contact if no improvement by next Wednesday.

## 2024-03-09 NOTE — Patient Instructions (Signed)
 VISIT SUMMARY:  During your visit, we addressed your persistent headaches, throat pain, and associated symptoms. We also discussed your liver enzymes and urinary symptoms.  YOUR PLAN:  TENSION-TYPE HEADACHE AND PARASPINAL MUSCLE SPASM OF NECK AND UPPER BACK: You have a headache and neck pain due to muscle tightness, but there is no nerve involvement. -Take Celebrex with food for pain relief. -Use methocarbamol as needed, preferably at night. -Make ergonomic adjustments and use neck support. -Perform neck stabilization exercises provided via MyChart.  ACUTE NASOPHARYNGITIS: Your symptoms are most consistent with a viral infection. Tests for strep and COVID were negative. -Use liquid lidocaine  for throat pain. -Continue saltwater gargles. -Use an intranasal steroid and oral antihistamine for nasal symptoms. -Monitor your symptoms and contact us  if there is no improvement by next Wednesday.  LIVER ENZYMES UNDER EVALUATION: Your liver is being evaluated by a hepatologist. Genetic testing for porphyria is pending, and autoimmune hepatitis is unlikely. A liver biopsy may be needed. -We have ordered liver function tests. -Please communicate your lab results to your hepatologist.  CHRONIC URINARY SYMPTOMS UNDER EVALUATION: You have chronic urinary symptoms that may be interstitial cystitis.  -We have ordered a urine analysis. -We will discuss the potential diagnosis of interstitial cystitis if the urine analysis is normal.

## 2024-03-09 NOTE — Assessment & Plan Note (Signed)
 Hepatic comorbidity and medication use - History of liver condition under evaluation by hepatology - Porphyria under consideration; autoimmune hepatitis ruled out - Awaiting genetic test results; possible liver biopsy if negative  Liver under evaluation Liver under hepatologist evaluation. Genetic testing for porphyria pending. - Order liver function tests. - Advise her to communicate lab results to hepatologist.

## 2024-03-09 NOTE — Progress Notes (Signed)
 Primary Care / Sports Medicine Office Visit  Patient Information:  Patient ID: Jeanette Sandoval, female DOB: 05-20-1986 Age: 38 y.o. MRN: [id number]   Jeanette Sandoval is a pleasant 38 y.o. female presenting with the following:  Chief Complaint  Patient presents with   Sore Throat    Patient presents today for sore throat x 4 days ago. Difficulty swallowing and pain radiates to ears.   Headache    Headache started 10 days ago. Patient has been taking IBU for headaches which helped a little.     Vitals:   03/09/24 1105  BP: 90/60  Pulse: 90  SpO2: 97%   Vitals:   03/09/24 1105  Weight: 148 lb (67.1 kg)  Height: 5' 5 (1.651 m)   Body mass index is 24.63 kg/m.  No results found.   Discussed the use of AI scribe software for clinical note transcription with the patient, who gave verbal consent to proceed.   Independent interpretation of notes and tests performed by another provider:   None  Procedures performed:   None  Pertinent History, Exam, Impression, and Recommendations:   Problem List Items Addressed This Visit     Cervical paraspinal muscle spasm   Cephalalgia (headache) - Persistent headache for 10 days, primarily located in the forehead and temple regions - Occurs daily and is severe enough to wake her from sleep - Pain intensity rated 8/10, reduced to 3/10 with ibuprofen  - Ibuprofen  provides temporary relief for 5 to 6 hours - Avoids frequent use of painkillers due to concerns about liver health  PALPATION: Palpable paraspinal muscular spasm throughout the trapezius regions bilaterally. SPECIAL TESTS: Negative Spurling's test bilaterally.  Tension-type headache and paraspinal muscle spasm of neck and upper back Bitemporal headache and neck pain due to muscle tightness. No nerve involvement. Advised short-term medication use to avoid liver strain. - Prescribe Celebrex with food for pain relief. - Prescribe methocarbamol as needed, preferably at  night. - Advise on neck support and ergonomic adjustments. - Provide neck stabilization exercises via MyChart.      Dysuria   Chronic urinary symptoms under evaluation Chronic urinary symptoms possibly interstitial cystitis. Previous urine tests have showed UTI and others have been normal. - Order urine analysis. - Discuss potential interstitial cystitis diagnosis if urine analysis is normal.      Relevant Orders   Urinalysis, Routine w reflex microscopic   Nasopharyngitis acute - Primary   Pharyngodynia (throat pain) - Throat pain for 4 days, predominantly on the left side - Pain worsened by swallowing and consumption of sour or salty foods - Described as a burning sensation radiating down the throat - Ibuprofen  400 mg every 8 to 10 hours reduces severity but does not eliminate pain - Saltwater gargles and chamomile tea used for symptomatic relief  Otolaryngologic and associated symptoms - Neck soreness and ear pain present - No tingling or numbness in the arms - Itchy eyes for a shorter duration than throat pain - No significant increase in nasal congestion beyond baseline  Constitutional symptoms - Chills and possible fever two nights ago, with sensation of cold followed by sweating after medication  Physical Exam HEENT: Oropharynx with erythema and mild swelling, no exudates. Nasopharynx with mild cobblestone pattern, bilateral turbinate erythema and swelling. Tympanic membranes and canals benign bilaterally. NECK: Prominent submandibular lymph nodes, tenderness left greater than right, shotty posterior chain cervical lymphadenopathy bilaterally. CHEST: Lungs clear to auscultation bilaterally, no wheezes, rales, or rhonchi.  Acute nasopharyngitis with lymphadenopathy Symptoms  most consistent with viral etiology. Negative strep and COVID tests. - Prescribe liquid lidocaine  for throat pain. - Recommend saltwater gargles. - Advise intranasal steroid and oral antihistamine for  nasal symptoms. - Instruct to monitor symptoms and contact if no improvement by next Wednesday.      Relevant Orders   POCT Rapid Strep A (Completed)   Transaminitis   Hepatic comorbidity and medication use - History of liver condition under evaluation by hepatology - Porphyria under consideration; autoimmune hepatitis ruled out - Awaiting genetic test results; possible liver biopsy if negative  Liver under evaluation Liver under hepatologist evaluation. Genetic testing for porphyria pending. - Order liver function tests. - Advise her to communicate lab results to hepatologist.      Relevant Orders   Comprehensive Metabolic Panel (CMET)     Orders & Medications Medications:  Meds ordered this encounter  Medications   methocarbamol (ROBAXIN) 500 MG tablet    Sig: Take 1 tablet (500 mg total) by mouth every 8 (eight) hours as needed for muscle spasms.    Dispense:  30 tablet    Refill:  0   celecoxib (CELEBREX) 100 MG capsule    Sig: Take 1 capsule (100 mg total) by mouth 2 (two) times daily as needed.    Dispense:  60 capsule    Refill:  0   lidocaine  (XYLOCAINE ) 2 % solution    Sig: Use as directed 15 mLs in the mouth or throat as needed for mouth pain.    Dispense:  100 mL    Refill:  0   Orders Placed This Encounter  Procedures   Comprehensive Metabolic Panel (CMET)   Urinalysis, Routine w reflex microscopic     No follow-ups on file.     Jeanette JINNY Ku, MD, [facility]   Primary Care Sports Medicine Primary Care and Sports Medicine at [facility]

## 2024-03-09 NOTE — Assessment & Plan Note (Signed)
 Cephalalgia (headache) - Persistent headache for 10 days, primarily located in the forehead and temple regions - Occurs daily and is severe enough to wake her from sleep - Pain intensity rated 8/10, reduced to 3/10 with ibuprofen  - Ibuprofen  provides temporary relief for 5 to 6 hours - Avoids frequent use of painkillers due to concerns about liver health  PALPATION: Palpable paraspinal muscular spasm throughout the trapezius regions bilaterally. SPECIAL TESTS: Negative Spurling's test bilaterally.  Tension-type headache and paraspinal muscle spasm of neck and upper back Bitemporal headache and neck pain due to muscle tightness. No nerve involvement. Advised short-term medication use to avoid liver strain. - Prescribe Celebrex with food for pain relief. - Prescribe methocarbamol as needed, preferably at night. - Advise on neck support and ergonomic adjustments. - Provide neck stabilization exercises via MyChart.

## 2024-03-09 NOTE — Assessment & Plan Note (Signed)
 Chronic urinary symptoms under evaluation Chronic urinary symptoms possibly interstitial cystitis. Previous urine tests have showed UTI and others have been normal. - Order urine analysis. - Discuss potential interstitial cystitis diagnosis if urine analysis is normal.

## 2024-03-10 LAB — URINALYSIS, ROUTINE W REFLEX MICROSCOPIC
Bilirubin, UA: NEGATIVE
Glucose, UA: NEGATIVE
Nitrite, UA: NEGATIVE
Protein,UA: NEGATIVE
RBC, UA: NEGATIVE
Specific Gravity, UA: 1.022 (ref 1.005–1.030)
Urobilinogen, Ur: 1 mg/dL (ref 0.2–1.0)
pH, UA: 5.5 (ref 5.0–7.5)

## 2024-03-10 LAB — COMPREHENSIVE METABOLIC PANEL WITH GFR
ALT: 41 IU/L — ABNORMAL HIGH (ref 0–32)
AST: 29 IU/L (ref 0–40)
Albumin: 4.5 g/dL (ref 3.9–4.9)
Alkaline Phosphatase: 124 IU/L — ABNORMAL HIGH (ref 41–116)
BUN/Creatinine Ratio: 18 (ref 9–23)
BUN: 13 mg/dL (ref 6–20)
Bilirubin Total: 0.3 mg/dL (ref 0.0–1.2)
CO2: 22 mmol/L (ref 20–29)
Calcium: 9.6 mg/dL (ref 8.7–10.2)
Chloride: 101 mmol/L (ref 96–106)
Creatinine, Ser: 0.71 mg/dL (ref 0.57–1.00)
Globulin, Total: 3.3 g/dL (ref 1.5–4.5)
Glucose: 122 mg/dL — ABNORMAL HIGH (ref 70–99)
Potassium: 4 mmol/L (ref 3.5–5.2)
Sodium: 140 mmol/L (ref 134–144)
Total Protein: 7.8 g/dL (ref 6.0–8.5)
eGFR: 114 mL/min/1.73 (ref >59)

## 2024-03-10 LAB — MICROSCOPIC EXAMINATION
Casts: NONE SEEN /LPF
Epithelial Cells (non renal): >10 /HPF — AB (ref 0–10)

## 2024-03-12 ENCOUNTER — Ambulatory Visit: Payer: Self-pay | Admitting: Family Medicine

## 2024-03-12 MED ORDER — CIPROFLOXACIN HCL 250 MG PO TABS: 250.0000 mg | ORAL_TABLET | Freq: Two times a day (BID) | ORAL | 0 refills | Status: AC

## 2024-03-13 NOTE — Progress Notes (Signed)
 LMOM fr patient to call me back.  JM

## 2024-03-14 NOTE — Progress Notes (Signed)
 Spoke with patient about labs and she verbalized understanding.   JM

## 2024-04-02 ENCOUNTER — Encounter: Payer: Self-pay | Admitting: Physician Assistant

## 2024-04-02 ENCOUNTER — Ambulatory Visit: Admitting: Physician Assistant

## 2024-04-02 ENCOUNTER — Other Ambulatory Visit

## 2024-04-02 ENCOUNTER — Ambulatory Visit: Payer: Self-pay | Admitting: Physician Assistant

## 2024-04-02 VITALS — BP 92/60 | HR 79 | Ht 65.0 in | Wt 149.0 lb

## 2024-04-02 DIAGNOSIS — R112 Nausea with vomiting, unspecified: Secondary | ICD-10-CM

## 2024-04-02 DIAGNOSIS — R7989 Other specified abnormal findings of blood chemistry: Secondary | ICD-10-CM

## 2024-04-02 DIAGNOSIS — R1013 Epigastric pain: Secondary | ICD-10-CM

## 2024-04-02 LAB — HEPATIC FUNCTION PANEL
ALT: 183 U/L — ABNORMAL HIGH (ref 0–35)
AST: 130 U/L — ABNORMAL HIGH (ref 0–37)
Albumin: 4.4 g/dL (ref 3.5–5.2)
Alkaline Phosphatase: 177 U/L — ABNORMAL HIGH (ref 39–117)
Bilirubin, Direct: 0 mg/dL (ref 0.0–0.3)
Total Bilirubin: 0.4 mg/dL (ref 0.2–1.2)
Total Protein: 8 g/dL (ref 6.0–8.3)

## 2024-04-02 LAB — LIPASE: Lipase: 40 U/L (ref 11.0–59.0)

## 2024-04-02 NOTE — Progress Notes (Signed)
 Please call and notify patient her liver tests are elevated again.  Lipase pancreas test is normal. Please schedule abdominal MRI/MRCP to evaluate for bile duct stone.  Diagnosis epigastric pain and elevated LFTs.  Prior cholecystectomy. Ellouise Console, PA-C

## 2024-04-02 NOTE — Patient Instructions (Signed)
 Your provider has requested that you go to the basement level for lab work before leaving today. Press B on the elevator. The lab is located at the first door on the left as you exit the elevator.  You have been scheduled for an endoscopy. Please follow written instructions given to you at your visit today.  If you use inhalers (even only as needed), please bring them with you on the day of your procedure.  If you take any of the following medications, they will need to be adjusted prior to your procedure:   DO NOT TAKE 7 DAYS PRIOR TO TEST- Trulicity (dulaglutide) Ozempic, Wegovy (semaglutide) Mounjaro, Zepbound (tirzepatide) Bydureon Bcise (exanatide extended release)  DO NOT TAKE 1 DAY PRIOR TO YOUR TEST Rybelsus (semaglutide) Adlyxin (lixisenatide) Victoza (liraglutide) Byetta (exanatide) ___________________________________________________________________________  _______________________________________________________  If your blood pressure at your visit was 140/90 or greater, please contact your primary care physician to follow up on this.  _______________________________________________________  If you are age 65 or older, your body mass index should be between 23-30. Your Body mass index is 24.79 kg/m. If this is out of the aforementioned range listed, please consider follow up with your Primary Care Provider.  If you are age 3 or younger, your body mass index should be between 19-25. Your Body mass index is 24.79 kg/m. If this is out of the aformentioned range listed, please consider follow up with your Primary Care Provider.   ________________________________________________________  The [facility] providers would like to encourage you to use MYCHART to communicate with providers for non-urgent requests or questions.  Due to long hold times on the telephone, sending your provider a message by [facility] may be a faster and more efficient way to get a response.  Please  allow 48 business hours for a response.  Please remember that this is for non-urgent requests.  _______________________________________________________  [facility] is using a team-based approach to care.  Your team is made up of your doctor and two to three APPS. Our APPS (Nurse Practitioners and Physician Assistants) work with your physician to ensure care continuity for you. They are fully qualified to address your health concerns and develop a treatment plan. They communicate directly with your gastroenterologist to care for you. Seeing the Advanced Practice Practitioners on your physician's team can help you by facilitating care more promptly, often allowing for earlier appointments, access to diagnostic testing, procedures, and other specialty referrals.

## 2024-04-02 NOTE — Progress Notes (Signed)
 Jeanette Console, PA-C [address] [place], [place]  [address] Phone: [phone]   Gastroenterology Consultation  Referring Provider:     Alvia Jeanette Sandoval PARAS, MD Primary Care Physician:  Alvia Jeanette Sandoval PARAS, MD Primary Gastroenterologist:  Jeanette Console, PA-C / Jeanette Holt, MD  Reason for Consultation:   Elevated LFTs, upper abdominal pain        HPI:   Discussed the use of AI scribe software for clinical note transcription with the patient, who gave verbal consent to proceed.  New patient.  Here to evaluate transiently elevated liver transaminases and upper abdominal pain since January 2024.  She was evaluated by Stephane Quest, NP at [facility] hepatology 01/19/2024 for elevated LFTs.  She referred patient to us  to further evaluate upper abdominal pain.    Extensive liver labs done 12/2023.  Serologic evaluation was negative for viral hepatitis.  Positive ANA and ASMA.  Negative AMA.  Immune to hepatitis A.  Nonimmune to hepatitis B.  Elevated GGT 279.    She reports a history of intrahepatic cholestasis of pregnancy and hyperemesis during pregnancy.  Patient was seen at [facility] ED 02/01/2024 to evaluate acute upper bilateral abdominal pain.  Radiated around her entire rib cage.  Intermittent waves of upper abdominal pain for many months.  No chest pain or shortness of breath.  No nausea, vomiting, diarrhea, or fever.  Labs showed significantly elevated LFTs with AST 134, ALT 146, alk phos 207.  Mild elevated lipase 57.  Mild elevated white count 11.2.  Chest x-ray showed no acute findings.  Negative pregnancy test.  01/10/2024 RUQ Dopple ultrasound: Prior cholecystectomy.  CBD 3 mm.  Normal liver.  No acute abnormality.  01/03/2024 CT abdomen pelvis with contrast: No acute abnormality.  Prior cholecystectomy.  Normal liver, pancreas, and intestines.  Prior appendectomy.  IUD in place.  Underwent laparoscopic cholecystectomy 09/2022 for cholelithiasis.  Currently taking pantoprazole  20 mg  once daily for GERD.  Oxycodone  as needed pain.  Celebrex 100 mg once daily for pain.  History of Present Illness Jeanette Sandoval is a 38 year old female who presents with recurrent upper abdominal pain. She has been experiencing recurrent upper abdominal pain since January 2024. The pain initially presented while she was sleeping and spread across her rib cage, prompting an ER visit where a heart attack was ruled out. The pain is described as severe, with an intensity reaching 10 out of 10, and can last from 30 minutes to 5 hours. It occurs unpredictably, sometimes starting in the back or left upper abdomen. The pain does not follow a predictable pattern related to food intake and occurs sporadically, with episodes separated by weeks at times.  Last episode of upper abdominal pain was yesterday.  Prior to that she had no pain for 3 weeks.  Pain is typically located in the epigastrium and bilateral lower rib cage and radiates to the bilateral mid back.  No pain today.  She has undergone various diagnostic evaluations, including a right upper quadrant Doppler ultrasound and abdominal pelvic CT in August 2025, which were unrevealing.  Her gallbladder was removed in May 2024, but this did not alleviate her symptoms. She has tried multiple medications, including dicyclomine , omeprazole , ibuprofen , and oxycodone . Oxycodone  provided some relief but caused nausea, which she found intolerable.  Her liver tests have been elevated over the past few months. She reports occasional nausea and vomiting associated with the pain. No diarrhea, constipation, or blood in her stool. She also  experiences sweating and a racing heart during pain episodes.  There is a family history of stomach cancer on her mother's side (Maternal Aunt).  Patient reports being treated for H. Pylori infection in 2024.  Repeat H. Pylori test in 2025 was reportedly negative.   Risk factors/pertinent social history: Denies history of injectable  drug use; Denies history of intranasal drug use; Has professionally placed tattoos most recently in December 2024; Denies history of high risk sexual behavior; Denies prior blood transfusion; Denies previous incarceration; Denies prior p[place]; Previous travel includes: [place] and [place]; Most recent alcohol use approximately 2 weeks ago. She reports consuming 4-6 alcohol-containing beverages per month. Patient denies any tobacco use. She is currently married to her husband Jeanette Sandoval, she has 1 child, and works full-time as a runner, broadcasting/film/video.   Component Ref Range & Units (hover) 3 wk ago (03/09/24) 2 mo ago (02/01/24) 2 mo ago (01/04/24) 3 mo ago (01/03/24) 1 yr ago (10/09/22) 1 yr ago (05/30/22)  Glucose 122 High  97 CM 92 CM 92 CM 89 CM 105 High  CM  BUN 13 17 11 13 15 18   Creatinine, Ser 0.71 0.74 R 0.70 R 0.67 R 0.72 R 0.68 R  eGFR 114       BUN/Creatinine Ratio 18       Sodium 140 140 R 139 R 139 R 139 R 137 R  Potassium 4.0 3.8 R 3.9 R 4.0 R 4.2 R 3.8 R  Chloride 101 104 R 103 R 104 R 103 R 103 R  CO2 22 25 R 26 R 25 R 27 R 24 R  Calcium 9.6 9.3 R 9.5 R 9.3 R 9.4 R 9.4 R  Total Protein 7.8 8.2 High  R 8.0 R 8.6 High  R 8.3 High  R 8.4 High  R  Albumin 4.5 4.2 R 4.2 R 4.5 R 4.5 R 4.3 R  Globulin, Total 3.3       Bilirubin Total 0.3 1.2 1.1 1.1 0.7 R 0.6 R  Alkaline Phosphatase 124 High  207 High  R 176 High  R 254 High  R 100 R 107 R  AST 29 134 High  R 266 High  R 871 High  R 67 High  R 42 High  R  ALT 41 High  146 High  R 451 High  R 783 High  R 80 High  R 29 R     Past Medical History:  Diagnosis Date   Asthma    Bronchiectasis (HCC)    Chest pain of uncertain etiology 06/08/2022   ED HPI   Jeanette Sandoval is a 38 y.o. female with history of asthma, bronchiectasis who presents to the emergency department with sudden onset chest pain and shortness of breath that woke her from sleep.  Chest pain is mostly in the left side of her chest and radiates under her arm.  She does have a  pleuritic component to her chest pain.  No fevers or cough.  No lower extremity swelling or pain.  N   GERD (gastroesophageal reflux disease)    Symptomatic cholelithiasis 10/06/2022    Past Surgical History:  Procedure Laterality Date   APPENDECTOMY  1999   CESAREAN SECTION  2019   CHOLECYSTECTOMY  09/2013   NASAL SINUS SURGERY  2014   WISDOM TOOTH EXTRACTION Bilateral 11/2020    Prior to Admission medications   Medication Sig Start Date End Date Taking? Authorizing Provider  albuterol  (VENTOLIN  HFA) 108 (90 Base) MCG/ACT inhaler Inhale  1-2 puffs into the lungs every 6 (six) hours as needed for wheezing or shortness of breath. 01/03/24   Matthews, Jason J, MD  budesonide -formoterol  (SYMBICORT ) 160-4.5 MCG/ACT inhaler Inhale 2 puffs into the lungs 2 (two) times daily. Patient not taking: Reported on 03/09/2024 01/03/23   Tamea Dedra CROME, MD  celecoxib (CELEBREX) 100 MG capsule Take 1 capsule (100 mg total) by mouth 2 (two) times daily as needed. 03/09/24   Alvia Jeanette Sandoval PARAS, MD  lidocaine  (XYLOCAINE ) 2 % solution Use as directed 15 mLs in the mouth or throat as needed for mouth pain. 03/09/24   Matthews, Jason J, MD  methocarbamol (ROBAXIN) 500 MG tablet Take 1 tablet (500 mg total) by mouth every 8 (eight) hours as needed for muscle spasms. 03/09/24   Alvia Jeanette Sandoval PARAS, MD  montelukast  (SINGULAIR ) 10 MG tablet TAKE 1 TABLET BY MOUTH EVERY DAY Patient not taking: Reported on 03/09/2024 02/14/23   Tamea Dedra CROME, MD  oxyCODONE  (ROXICODONE ) 5 MG immediate release tablet Take 1 tablet (5 mg total) by mouth every 8 (eight) hours as needed. Patient not taking: Reported on 03/09/2024 02/01/24 01/31/25  Margrette Monte A, PA-C  pantoprazole  (PROTONIX ) 20 MG tablet Take 1 tablet (20 mg total) by mouth daily. 01/03/24   Matthews, Jason J, MD  PARAGARD  INTRAUTERINE COPPER  IU 1 each by Intrauterine route once. 11/17/23   Copland, Bernarda NOVAK, PA-C    Family History  Problem Relation Age of Onset    Lupus Mother    Cervical cancer Mother        30/40s   Hypertension Father    Colon cancer Maternal Aunt        or Stomach   Coronary artery disease Maternal Grandmother    Peripheral Artery Disease Maternal Grandmother    Liver cancer Paternal Grandmother    Breast cancer Neg Hx      Social History   Tobacco Use   Smoking status: Never    Passive exposure: Never   Smokeless tobacco: Never  Vaping Use   Vaping status: Never Used  Substance Use Topics   Alcohol use: Yes    Comment: occassional   Drug use: Never    Allergies as of 04/02/2024   (No Known Allergies)    Review of Systems:    All systems reviewed and negative except where noted in HPI.   Physical Exam:  BP 92/60   Pulse 79   Ht 5' 5 (1.651 m)   Wt 149 lb (67.6 kg)   BMI 24.79 kg/m  No LMP recorded. (Menstrual status: IUD).  General:   Alert,  Well-developed, well-nourished, pleasant and cooperative in NAD Lungs:  Respirations even and unlabored.  Clear throughout to auscultation.   No wheezes, crackles, or rhonchi. No acute distress. Heart:  Regular rate and rhythm; no murmurs, clicks, rubs, or gallops. Abdomen:  Normal bowel sounds.  No bruits.  Soft, and non-distended without masses, hepatosplenomegaly or hernias noted.  Mild Epigastric Tenderness. Rest of abdomen is not tender.  No RUQ or LUQ tenderness.  No guarding or rebound tenderness.    Neurologic:  Alert and oriented x3;  grossly normal neurologically. Psych:  Alert and cooperative. Normal mood and affect.   Imaging Studies: No results found.  Labs: CBC    Component Value Date/Time   WBC 11.2 (H) 02/01/2024 1613   RBC 4.83 02/01/2024 1613   HGB 14.5 02/01/2024 1613   HCT 43.0 02/01/2024 1613   PLT 273 02/01/2024 1613   MCV 89.0  02/01/2024 1613    CMP     Component Value Date/Time   NA 140 03/09/2024 1551   K 4.0 03/09/2024 1551   CL 101 03/09/2024 1551   CO2 22 03/09/2024 1551   GLUCOSE 122 (H) 03/09/2024 1551   GLUCOSE 97  02/01/2024 1613   BUN 13 03/09/2024 1551   CREATININE 0.71 03/09/2024 1551   CALCIUM 9.6 03/09/2024 1551   PROT 7.8 03/09/2024 1551   ALBUMIN 4.5 03/09/2024 1551   AST 29 03/09/2024 1551   ALT 41 (H) 03/09/2024 1551   ALKPHOS 124 (H) 03/09/2024 1551   BILITOT 0.3 03/09/2024 1551   GFRNONAA >60 02/01/2024 1613    Assessment and Plan:   Maelle Sheaffer is a 38 y.o. y/o female has been referred for:   Upper Abdominal Pain: Intermittent episodes of upper abdominal pain for 2 years.  Uncertain etiology.  Cholecystectomy did not help.  Differential includes peptic ulcer disease, gastritis, H. pylori infection, bile duct stone, pancreatitis.  Remote family history of stomach cancer and maternal great aunt. - Labs lipase - Scheduling EGD I discussed risks of EGD with patient to include risk of bleeding, perforation, and risk of sedation.  Patient expressed understanding and agrees to proceed with EGD.  - Advised keeping a journal of pain episodes, including frequency, duration, and associated activities. - Recommended avoiding NSAIDs due to risk of stomach ulcers. - Advised caution with Tylenol  use due to liver concerns. - Consider abdominal MRI/MRCP to evaluate for bile duct stone pending lab results and symptoms.  Elevated Liver Enzymes: Currently being evaluated by Stephane Duncans, NP at [facility]. Labs were negative for acute viral hepatitis.   - Lab hepatic panel - Continue follow-up with [facility] hepatology - Consider abdominal MRI/MRCP to evaluate for bile duct stone pending lab results and symptoms.  3.  History of H. pylori infection treated in 2024.  Follow-up H. pylori test in 2025 reportedly negative. - Check biopsies during EGD to confirm eradication.  Follow up 4 weeks after EGD with TG.  Jeanette Console, PA-C  ADDENDUM: Patient's lab results returned this evening.  Liver tests were elevated again with alk phos 177, AST 130, ALT 183, normal total bilirubin 0.4.  Normal  lipase 40.   I will go ahead and order abdominal MRI/MRCP due to recurrent elevated LFTs and upper abdominal pain.

## 2024-04-03 ENCOUNTER — Other Ambulatory Visit: Payer: Self-pay

## 2024-04-03 DIAGNOSIS — R7989 Other specified abnormal findings of blood chemistry: Secondary | ICD-10-CM

## 2024-04-03 DIAGNOSIS — R1013 Epigastric pain: Secondary | ICD-10-CM

## 2024-04-04 ENCOUNTER — Ambulatory Visit: Admitting: Gastroenterology

## 2024-04-04 ENCOUNTER — Other Ambulatory Visit: Payer: Self-pay | Admitting: Obstetrics and Gynecology

## 2024-04-04 ENCOUNTER — Encounter: Payer: Self-pay | Admitting: Gastroenterology

## 2024-04-04 VITALS — BP 91/59 | HR 73 | Temp 98.2°F | Resp 12 | Ht 65.0 in | Wt 149.0 lb

## 2024-04-04 DIAGNOSIS — K2950 Unspecified chronic gastritis without bleeding: Secondary | ICD-10-CM

## 2024-04-04 DIAGNOSIS — R1013 Epigastric pain: Secondary | ICD-10-CM

## 2024-04-04 DIAGNOSIS — N390 Urinary tract infection, site not specified: Secondary | ICD-10-CM

## 2024-04-04 MED ORDER — SODIUM CHLORIDE 0.9 % IV SOLN: 500.0000 mL | Freq: Once | INTRAVENOUS | Status: DC

## 2024-04-04 NOTE — Op Note (Signed)
 [facility] Patient Name: Jeanette Sandoval Procedure Date: 04/04/2024 10:32 AM MRN: [id number] Endoscopist: Glendia E. Stacia , MD, [id number] Age: 38 Referring MD:  Date of Birth: 01-May-1986 Gender: Female Account #: [id number] Procedure:                Upper GI endoscopy Indications:              Epigastric abdominal pain, elevated liver enzymes Medicines:                Monitored Anesthesia Care Procedure:                Pre-Anesthesia Assessment:                           - Prior to the procedure, a History and Physical                            was performed, and patient medications and                            allergies were reviewed. The patient's tolerance of                            previous anesthesia was also reviewed. The risks                            and benefits of the procedure and the sedation                            options and risks were discussed with the patient.                            All questions were answered, and informed consent                            was obtained. Prior Anticoagulants: The patient has                            taken no anticoagulant or antiplatelet agents. ASA                            Grade Assessment: II - A patient with mild systemic                            disease. After reviewing the risks and benefits,                            the patient was deemed in satisfactory condition to                            undergo the procedure.                           After obtaining informed consent, the endoscope was  passed under direct vision. Throughout the                            procedure, the patient's blood pressure, pulse, and                            oxygen saturations were monitored continuously. The                            GIF [id number] #[id number] was introduced through the                            mouth, and advanced to the third part of duodenum.                             The upper GI endoscopy was accomplished without                            difficulty. The patient tolerated the procedure                            well. Scope In: Scope Out: Findings:                 The examined esophagus was normal.                           The entire examined stomach was normal. Biopsies                            were taken with a cold forceps for Helicobacter                            pylori testing. Estimated blood loss was minimal.                           The examined duodenum was normal. Biopsies for                            histology were taken with a cold forceps for                            evaluation of celiac disease. Estimated blood loss                            was minimal. Complications:            No immediate complications. Estimated Blood Loss:     Estimated blood loss was minimal. Impression:               - Normal esophagus.                           - Normal stomach. Biopsied.                           -  Normal examined duodenum. Biopsied.                           - No endoscopic abnormalities to explain abdominal                            pain. Recommendation:           - Patient has a contact number available for                            emergencies. The signs and symptoms of potential                            delayed complications were discussed with the                            patient. Return to normal activities tomorrow.                            Written discharge instructions were provided to the                            patient.                           - Resume previous diet.                           - Continue present medications.                           - Await pathology results.                           - Proceed with MRCP to evaluate for common bile                            duct stones Laurieanne Galloway E. Stacia, MD 04/04/2024 11:00:16 AM This report has been signed electronically.

## 2024-04-04 NOTE — Progress Notes (Signed)
 Report to PACU, RN, vss, BBS= Clear.

## 2024-04-04 NOTE — Progress Notes (Signed)
 Called to room to assist during endoscopic procedure.  Patient ID and intended procedure confirmed with present staff. Received instructions for my participation in the procedure from the performing physician.

## 2024-04-04 NOTE — Patient Instructions (Signed)
 Please read handouts provided. Continue present medications. Await pathology results. Resume previous diet. Proceed with MRCP to evaluate for common bile duct stones.   YOU HAD AN ENDOSCOPIC PROCEDURE TODAY AT THE [facility]:   Refer to the procedure report that was given to you for any specific questions about what was found during the examination.  If the procedure report does not answer your questions, please call your gastroenterologist to clarify.  If you requested that your care partner not be given the details of your procedure findings, then the procedure report has been included in a sealed envelope for you to review at your convenience later.  YOU SHOULD EXPECT: Some feelings of bloating in the abdomen. Passage of more gas than usual.  Walking can help get rid of the air that was put into your GI tract during the procedure and reduce the bloating. If you had a lower endoscopy (such as a colonoscopy or flexible sigmoidoscopy) you may notice spotting of blood in your stool or on the toilet paper. If you underwent a bowel prep for your procedure, you may not have a normal bowel movement for a few days.  Please Note:  You might notice some irritation and congestion in your nose or some drainage.  This is from the oxygen used during your procedure.  There is no need for concern and it should clear up in a day or so.  SYMPTOMS TO REPORT IMMEDIATELY:  Following upper endoscopy (EGD)  Vomiting of blood or coffee ground material  New chest pain or pain under the shoulder blades  Painful or persistently difficult swallowing  New shortness of breath  Fever of 100F or higher  Black, tarry-looking stools  For urgent or emergent issues, a gastroenterologist can be reached at any hour by calling [phone]. Do not use MyChart messaging for urgent concerns.    DIET:  We do recommend a small meal at first, but then you may proceed to your regular diet.  Drink plenty of fluids  but you should avoid alcoholic beverages for 24 hours.  ACTIVITY:  You should plan to take it easy for the rest of today and you should NOT DRIVE or use heavy machinery until tomorrow (because of the sedation medicines used during the test).    FOLLOW UP: Our staff will call the number listed on your records the next business day following your procedure.  We will call around 7:15- 8:00 am to check on you and address any questions or concerns that you may have regarding the information given to you following your procedure. If we do not reach you, we will leave a message.     If any biopsies were taken you will be contacted by phone or by letter within the next 1-3 weeks.  Please call us  at [phone] if you have not heard about the biopsies in 3 weeks.    SIGNATURES/CONFIDENTIALITY: You and/or your care partner have signed paperwork which will be entered into your electronic medical record.  These signatures attest to the fact that that the information above on your After Visit Summary has been reviewed and is understood.  Full responsibility of the confidentiality of this discharge information lies with you and/or your care-partner.

## 2024-04-04 NOTE — Progress Notes (Signed)
 History and Physical Interval Note:  04/04/2024 10:35 AM  Jeanette Sandoval  has presented today for endoscopic procedure(s), with the diagnosis of  Encounter Diagnosis  Name Primary?   Abdominal pain, epigastric Yes  .  The various methods of evaluation and treatment have been discussed with the patient and/or family. After consideration of risks, benefits and other options for treatment, the patient has consented to  the endoscopic procedure(s).   The patient's history has been reviewed, patient examined, no change in status, stable for endoscopic procedure(s).  I have reviewed the patient's chart and labs.  Questions were answered to the patient's satisfaction.     Alexius Ellington E. Stacia, MD [facility]

## 2024-04-04 NOTE — Progress Notes (Signed)
 VS by CL

## 2024-04-05 ENCOUNTER — Other Ambulatory Visit: Payer: Self-pay

## 2024-04-05 ENCOUNTER — Telehealth: Payer: Self-pay

## 2024-04-05 NOTE — Telephone Encounter (Signed)
  Follow up Call-     04/04/2024   10:00 AM  Call back number  Post procedure Call Back phone  # [phone]  Permission to leave phone message Yes     Patient questions:  Do you have a fever, pain , or abdominal swelling? No. Pain Score  0 *  Have you tolerated food without any problems? Yes.    Have you been able to return to your normal activities? Yes.    Do you have any questions about your discharge instructions: Diet   No. Medications  No. Follow up visit  No.  Do you have questions or concerns about your Care? No.  Actions: * If pain score is 4 or above: No action needed, pain <4.

## 2024-04-06 LAB — SURGICAL PATHOLOGY

## 2024-04-10 ENCOUNTER — Ambulatory Visit
Admission: RE | Admit: 2024-04-10 | Discharge: 2024-04-10 | Disposition: A | Discharge status: Home / Self Care | Source: Ambulatory Visit | Attending: Physician Assistant | Admitting: Physician Assistant

## 2024-04-10 ENCOUNTER — Other Ambulatory Visit: Payer: Self-pay | Admitting: Physician Assistant

## 2024-04-10 DIAGNOSIS — R1084 Generalized abdominal pain: Secondary | ICD-10-CM | POA: Diagnosis present

## 2024-04-10 DIAGNOSIS — R1013 Epigastric pain: Secondary | ICD-10-CM | POA: Insufficient documentation

## 2024-04-10 DIAGNOSIS — R7989 Other specified abnormal findings of blood chemistry: Secondary | ICD-10-CM | POA: Diagnosis present

## 2024-04-10 MED ORDER — GADOBUTROL 1 MMOL/ML IV SOLN
7.0000 mL | Freq: Once | INTRAVENOUS | Status: AC | PRN
  Administered 2024-04-10: 7 mL via INTRAVENOUS

## 2024-04-11 ENCOUNTER — Ambulatory Visit: Payer: Self-pay | Admitting: Gastroenterology

## 2024-04-11 NOTE — Progress Notes (Signed)
 Jeanette Sandoval,  The biopsies taken from you duodenum were unremarkable, with no evidence of celiac disease or other pathology. The biopsies taken from your stomach were notable for mild chronic gastritis (inflammation) which is a common finding, but there was no evidence of Helicobacter pylori infection. This common finding is not likely to explain abdominal pain and there is no specific treatment or further evaluation recommended for this finding.  Will await MRI results to look for a common bile duct stone as the cause of your pain.

## 2024-04-12 ENCOUNTER — Other Ambulatory Visit (HOSPITAL_COMMUNITY): Payer: Self-pay | Admitting: Nurse Practitioner

## 2024-04-12 DIAGNOSIS — R1010 Upper abdominal pain, unspecified: Secondary | ICD-10-CM

## 2024-04-12 DIAGNOSIS — R748 Abnormal levels of other serum enzymes: Secondary | ICD-10-CM

## 2024-04-16 ENCOUNTER — Other Ambulatory Visit: Payer: Self-pay | Admitting: *Deleted

## 2024-04-16 ENCOUNTER — Ambulatory Visit: Payer: Self-pay | Admitting: Physician Assistant

## 2024-04-16 ENCOUNTER — Other Ambulatory Visit: Payer: Self-pay

## 2024-04-16 DIAGNOSIS — K8050 Calculus of bile duct without cholangitis or cholecystitis without obstruction: Secondary | ICD-10-CM

## 2024-04-18 ENCOUNTER — Ambulatory Visit
Admission: RE | Admit: 2024-04-18 | Discharge: 2024-04-18 | Disposition: A | Discharge status: Home / Self Care | Attending: Gastroenterology | Admitting: Gastroenterology

## 2024-04-18 ENCOUNTER — Other Ambulatory Visit: Payer: Self-pay

## 2024-04-18 ENCOUNTER — Ambulatory Visit: Admitting: Certified Registered Nurse Anesthetist

## 2024-04-18 ENCOUNTER — Ambulatory Visit

## 2024-04-18 ENCOUNTER — Encounter: Payer: Self-pay | Admitting: Gastroenterology

## 2024-04-18 ENCOUNTER — Encounter
Admission: RE | Disposition: A | Discharge status: Home / Self Care | Payer: Self-pay | Source: Home / Self Care | Attending: Gastroenterology

## 2024-04-18 DIAGNOSIS — J45909 Unspecified asthma, uncomplicated: Secondary | ICD-10-CM | POA: Insufficient documentation

## 2024-04-18 DIAGNOSIS — Z7951 Long term (current) use of inhaled steroids: Secondary | ICD-10-CM | POA: Insufficient documentation

## 2024-04-18 DIAGNOSIS — Z79899 Other long term (current) drug therapy: Secondary | ICD-10-CM | POA: Insufficient documentation

## 2024-04-18 DIAGNOSIS — K219 Gastro-esophageal reflux disease without esophagitis: Secondary | ICD-10-CM | POA: Insufficient documentation

## 2024-04-18 DIAGNOSIS — K8050 Calculus of bile duct without cholangitis or cholecystitis without obstruction: Secondary | ICD-10-CM | POA: Insufficient documentation

## 2024-04-18 HISTORY — PX: ERCP: SHX5425

## 2024-04-18 LAB — POCT PREGNANCY, URINE: Preg Test, Ur: NEGATIVE

## 2024-04-18 SURGERY — ERCP, WITH INTERVENTION IF INDICATED
Anesthesia: General

## 2024-04-18 MED ORDER — PROPOFOL 10 MG/ML IV BOLUS
INTRAVENOUS | Status: DC | PRN
  Administered 2024-04-18: 90 mg via INTRAVENOUS
  Administered 2024-04-18: 150 ug/kg/min via INTRAVENOUS

## 2024-04-18 MED ORDER — DEXMEDETOMIDINE HCL IN NACL 200 MCG/50ML IV SOLN
INTRAVENOUS | Status: DC | PRN
  Administered 2024-04-18: 12 ug via INTRAVENOUS

## 2024-04-18 MED ORDER — KETOROLAC TROMETHAMINE 15 MG/ML IJ SOLN
INTRAMUSCULAR | Status: AC
Start: 2024-04-18 — End: 2024-04-18
  Filled 2024-04-18: qty 1

## 2024-04-18 MED ORDER — KETOROLAC TROMETHAMINE 15 MG/ML IJ SOLN
15.0000 mg | Freq: Once | INTRAMUSCULAR | Status: AC
  Administered 2024-04-18: 15 mg via INTRAVENOUS

## 2024-04-18 MED ORDER — LIDOCAINE HCL (CARDIAC) PF 100 MG/5ML IV SOSY
PREFILLED_SYRINGE | INTRAVENOUS | Status: DC | PRN
  Administered 2024-04-18: 50 mg via INTRAVENOUS

## 2024-04-18 MED ORDER — DICLOFENAC SUPPOSITORY 100 MG
100.0000 mg | Freq: Once | RECTAL | Status: AC
  Administered 2024-04-18: 100 mg via RECTAL

## 2024-04-18 MED ORDER — PROPOFOL 1000 MG/100ML IV EMUL
INTRAVENOUS | Status: AC
  Filled 2024-04-18: qty 100

## 2024-04-18 MED ORDER — ACETAMINOPHEN 500 MG PO TABS: 1000.0000 mg | ORAL_TABLET | Freq: Once | ORAL | Status: DC

## 2024-04-18 MED ORDER — DICLOFENAC SUPPOSITORY 100 MG
RECTAL | Status: AC
Start: 2024-04-18 — End: 2024-04-18
  Filled 2024-04-18: qty 1

## 2024-04-18 MED ORDER — LACTATED RINGERS IV SOLN: INTRAVENOUS | Status: DC

## 2024-04-18 MED ORDER — KETOROLAC TROMETHAMINE 15 MG/ML IJ SOLN: 15.0000 mg | Freq: Once | INTRAMUSCULAR | Status: DC | PRN

## 2024-04-18 NOTE — Anesthesia Procedure Notes (Signed)
 Date/Time: 04/18/2024 10:28 AM  Performed by: Dominica Krabbe, CRNAPre-anesthesia Checklist: Patient identified, Emergency Drugs available, Suction available, Patient being monitored and Timeout performed Patient Re-evaluated:Patient Re-evaluated prior to induction Oxygen Delivery Method: Nasal cannula Preoxygenation: Pre-oxygenation with 100% oxygen Induction Type: IV induction

## 2024-04-18 NOTE — Op Note (Addendum)
 [facility] Gastroenterology Patient Name: Jeanette Sandoval Procedure Date: 04/18/2024 10:20 AM MRN: [id number] Account #: [id number] Date of Birth: 01/28/1986 Admit Type: Outpatient Age: 38 Room: [facility] ENDO ROOM 4 Gender: Female Note Status: Finalized Instrument Name: CELINDA [id number] Procedure:             ERCP Indications:           Common bile duct stone(s) Providers:             Rogelia Copping MD, MD Referring MD:          Selinda DOROTHA Ku (Referring MD) Medicines:             Propofol  per Anesthesia Complications:         No immediate complications. Procedure:             Pre-Anesthesia Assessment:                        - Prior to the procedure, a History and Physical was                         performed, and patient medications and allergies were                         reviewed. The patient's tolerance of previous                         anesthesia was also reviewed. The risks and benefits                         of the procedure and the sedation options and risks                         were discussed with the patient. All questions were                         answered, and informed consent was obtained. Prior                         Anticoagulants: The patient has taken no anticoagulant                         or antiplatelet agents. ASA Grade Assessment: II - A                         patient with mild systemic disease. After reviewing                         the risks and benefits, the patient was deemed in                         satisfactory condition to undergo the procedure.                        After obtaining informed consent, the scope was passed                         under direct vision. Throughout the procedure, the  patient's blood pressure, pulse, and oxygen                         saturations were monitored continuously. The                         Duodenoscope was introduced through the mouth, and                          used to inject contrast into and used to cannulate the                         bile duct. The ERCP was accomplished without                         difficulty. The patient tolerated the procedure well. Findings:      The scout film was normal. The esophagus was successfully intubated       under direct vision. The scope was advanced to a normal major papilla in       the descending duodenum without detailed examination of the pharynx,       larynx and associated structures, and upper GI tract. The upper GI tract       was grossly normal. The bile duct was deeply cannulated with the       short-nosed traction sphincterotome. Contrast was injected. I personally       interpreted the bile duct images. There was brisk flow of contrast       through the ducts. Image quality was excellent. Contrast extended to the       entire biliary tree. The lower third of the main bile duct contained two       stones. A wire was passed into the biliary tree. A 7 mm biliary       sphincterotomy was made with a traction (standard) sphincterotome using       ERBE electrocautery. There was no post-sphincterotomy bleeding. The       biliary tree was swept with a 15 mm balloon starting at the bifurcation.       Two stones were removed. No stones remained. Impression:            - Choledocholithiasis was found. Complete removal was                         accomplished by biliary sphincterotomy and balloon                         extraction.                        - A biliary sphincterotomy was performed.                        - The biliary tree was swept. Recommendation:        - Discharge patient to home.                        - Clear liquid diet today.                        - Watch for pancreatitis, bleeding, perforation, and  cholangitis. Procedure Code(s):     --- Professional ---                        [phone], Endoscopic retrograde cholangiopancreatography                          (ERCP); with removal of calculi/debris from                         biliary/pancreatic duct(s)                        [id number], Endoscopic retrograde cholangiopancreatography                         (ERCP); with sphincterotomy/papillotomy                        [phone], Endoscopic catheterization of the biliary                         ductal system, radiological supervision and                         interpretation Diagnosis Code(s):     --- Professional ---                        K80.50, Calculus of bile duct without cholangitis or                         cholecystitis without obstruction CPT copyright 2022 American Medical Association. All rights reserved. The codes documented in this report are preliminary and upon coder review may  be revised to meet current compliance requirements. Rogelia Copping MD, MD 04/18/2024 10:50:55 AM This report has been signed electronically. Number of Addenda: 0 Note Initiated On: 04/18/2024 10:20 AM Estimated Blood Loss:  Estimated blood loss: none.      [facility]

## 2024-04-18 NOTE — Anesthesia Preprocedure Evaluation (Addendum)
 Anesthesia Evaluation  Patient identified by MRN, date of birth, ID band Patient awake    Reviewed: Allergy & Precautions, H&P , NPO status , Patient's Chart, lab work & pertinent test results  Airway Mallampati: II  TM Distance: >3 FB Neck ROM: full    Dental no notable dental hx.    Pulmonary asthma    Pulmonary exam normal        Cardiovascular negative cardio ROS Normal cardiovascular exam     Neuro/Psych negative neurological ROS  negative psych ROS   GI/Hepatic Neg liver ROS,GERD  ,,  Endo/Other  negative endocrine ROS    Renal/GU negative Renal ROS  negative genitourinary   Musculoskeletal   Abdominal Normal abdominal exam  (+)   Peds  Hematology negative hematology ROS (+)   Anesthesia Other Findings  common bile duct stone  Past Medical History: No date: Asthma No date: Bronchiectasis (HCC) 06/08/2022: Chest pain of uncertain etiology     Comment:  ED HPI   Jeanette Sandoval a 38 y.o.femalewith history              of asthma, bronchiectasis who presents to the emergency               department with sudden onset chest pain and shortness of               breath that woke her from sleep. Chest pain is mostly in              the left side of her chest and radiates under her arm.               She does have a pleuritic component to her chest pain.               No fevers or cough. No lower extremity swelling or               pain. N No date: GERD (gastroesophageal reflux disease) 10/06/2022: Symptomatic cholelithiasis  Past Surgical History: 1999: APPENDECTOMY 2019: CESAREAN SECTION 09/2013: CHOLECYSTECTOMY 2014: NASAL SINUS SURGERY 11/2020: WISDOM TOOTH EXTRACTION; Bilateral     Reproductive/Obstetrics negative OB ROS                              Anesthesia Physical Anesthesia Plan  ASA: 2  Anesthesia Plan: General   Post-op Pain Management: Minimal or no  pain anticipated   Induction: Intravenous  PONV Risk Score and Plan: Propofol  infusion and TIVA  Airway Management Planned: Natural Airway  Additional Equipment:   Intra-op Plan:   Post-operative Plan:   Informed Consent: I have reviewed the patients History and Physical, chart, labs and discussed the procedure including the risks, benefits and alternatives for the proposed anesthesia with the patient or authorized representative who has indicated his/her understanding and acceptance.     Dental Advisory Given  Plan Discussed with: CRNA and Surgeon  Anesthesia Plan Comments:          Anesthesia Quick Evaluation

## 2024-04-18 NOTE — Transfer of Care (Signed)
 Immediate Anesthesia Transfer of Care Note  Patient: Jeanette Sandoval  Procedure(s) Performed: ERCP, WITH INTERVENTION IF INDICATED  Patient Location: Endoscopy Unit  Anesthesia Type:General  Level of Consciousness: awake, alert , and drowsy  Airway & Oxygen Therapy: Patient Spontanous Breathing  Post-op Assessment: Report given to RN and Post -op Vital signs reviewed and stable  Post vital signs: Reviewed and stable  Last Vitals:  Vitals Value Taken Time  BP 98/68 04/18/24 10:58  Temp    Pulse 67 04/18/24 10:59  Resp    SpO2 97 % 04/18/24 10:59  Vitals shown include unfiled device data.  Last Pain:  Vitals:   04/18/24 0916  TempSrc: Temporal  PainSc: 0-No pain         Complications: No notable events documented.

## 2024-04-18 NOTE — Anesthesia Postprocedure Evaluation (Signed)
 Anesthesia Post Note  Patient: Keyaira Clapham  Procedure(s) Performed: ERCP, WITH INTERVENTION IF INDICATED  Patient location during evaluation: Endoscopy Anesthesia Type: General Level of consciousness: awake and alert Pain management: pain level controlled Vital Signs Assessment: post-procedure vital signs reviewed and stable Respiratory status: spontaneous breathing, nonlabored ventilation and respiratory function stable Cardiovascular status: blood pressure returned to baseline and stable Postop Assessment: no apparent nausea or vomiting Anesthetic complications: no   No notable events documented.   Last Vitals:  Vitals:   04/18/24 1211 04/18/24 1237  BP: 96/65 95/64  Pulse: 63   Resp: 13   Temp:    SpO2: 100%     Last Pain:  Vitals:   04/18/24 1211  TempSrc:   PainSc: 2                  Camellia Merilee Louder

## 2024-04-18 NOTE — OR Nursing (Signed)
 Dr. Jinny informed of pt's 8/10 mid epigastric pain, likely due from procedure itself, 2 stone removal, vital signs normal and stable. Order from anesthesia for 15mg  IV Toradol  once for pain, LR drip wide open, encouraged pt to move around.

## 2024-04-18 NOTE — H&P (Signed)
 Rogelia Copping, MD [facility] [address] [place], [place] [address] Phone:[phone] Fax : [phone]  Primary Care Physician:  Alvia Selinda PARAS, MD Primary Gastroenterologist:  Dr. Copping  Pre-Procedure History & Physical: HPI:  Jeanette Sandoval is a 38 y.o. female is here for an ERCP.   Past Medical History:  Diagnosis Date   Asthma    Bronchiectasis [facility])    Chest pain of uncertain etiology 06/08/2022   ED HPI   Jeanette Sandoval is a 38 y.o. female with history of asthma, bronchiectasis who presents to the emergency department with sudden onset chest pain and shortness of breath that woke her from sleep.  Chest pain is mostly in the left side of her chest and radiates under her arm.  She does have a pleuritic component to her chest pain.  No fevers or cough.  No lower extremity swelling or pain.  N   GERD (gastroesophageal reflux disease)    Symptomatic cholelithiasis 10/06/2022    Past Surgical History:  Procedure Laterality Date   APPENDECTOMY  1999   CESAREAN SECTION  2019   CHOLECYSTECTOMY  09/2013   NASAL SINUS SURGERY  2014   WISDOM TOOTH EXTRACTION Bilateral 11/2020    Prior to Admission medications   Medication Sig Start Date End Date Taking? Authorizing Provider  montelukast  (SINGULAIR ) 10 MG tablet TAKE 1 TABLET BY MOUTH EVERY DAY 02/14/23  Yes Tamea Dedra CROME, MD  pantoprazole  (PROTONIX ) 20 MG tablet Take 1 tablet (20 mg total) by mouth daily. 01/03/24  Yes Alvia Selinda PARAS, MD  PARAGARD  INTRAUTERINE COPPER  IU 1 each by Intrauterine route once. 11/17/23  Yes Copland, Alicia B, PA-C  albuterol  (VENTOLIN  HFA) 108 (90 Base) MCG/ACT inhaler Inhale 1-2 puffs into the lungs every 6 (six) hours as needed for wheezing or shortness of breath. 01/03/24   Matthews, Jason J, MD  budesonide -formoterol  (SYMBICORT ) 160-4.5 MCG/ACT inhaler Inhale 2 puffs into the lungs 2 (two) times daily. 01/03/23   Tamea Dedra CROME, MD  celecoxib (CELEBREX) 100 MG capsule Take 1 capsule  (100 mg total) by mouth 2 (two) times daily as needed. Patient not taking: No sig reported 03/09/24   Alvia Selinda PARAS, MD  lidocaine  (XYLOCAINE ) 2 % solution Use as directed 15 mLs in the mouth or throat as needed for mouth pain. Patient not taking: No sig reported 03/09/24   Alvia Selinda PARAS, MD  methocarbamol (ROBAXIN) 500 MG tablet Take 1 tablet (500 mg total) by mouth every 8 (eight) hours as needed for muscle spasms. Patient not taking: No sig reported 03/09/24   Alvia Selinda PARAS, MD  oxyCODONE  (ROXICODONE ) 5 MG immediate release tablet Take 1 tablet (5 mg total) by mouth every 8 (eight) hours as needed. Patient not taking: No sig reported 02/01/24 01/31/25  Margrette Monte A, PA-C  sulfamethoxazole -trimethoprim  (BACTRIM ) 400-80 MG tablet Take 1 tablet by mouth once. Patient not taking: Reported on 04/18/2024 03/08/24   [provider]    Allergies as of 04/16/2024   (No Known Allergies)    Family History  Problem Relation Age of Onset   Lupus Mother    Cervical cancer Mother        30/40s   Hypertension Father    Stomach cancer Maternal Aunt    Colon cancer Maternal Aunt        or Stomach   Coronary artery disease Maternal Grandmother    Peripheral Artery Disease Maternal Grandmother    Liver cancer Paternal Grandmother    Breast cancer  Neg Hx    Esophageal cancer Neg Hx    Rectal cancer Neg Hx     Social History   Socioeconomic History   Marital status: Married    Spouse name: Dala Atlas   Number of children: 1   Years of education: 16   Highest education level: Bachelor's degree (e.g., BA, AB, BS)  Occupational History   Occupation: Facilities Manager  Tobacco Use   Smoking status: Never    Passive exposure: Never   Smokeless tobacco: Never  Vaping Use   Vaping status: Never Used  Substance and Sexual Activity   Alcohol use: Yes    Comment: occassional   Drug use: Never   Sexual activity: Yes    Birth control/protection: I.U.D.     Comment: Paragard   Other Topics Concern   Not on file  Social History Narrative   Not on file   Social Drivers of Health   Financial Resource Strain: Not on file  Food Insecurity: Low Risk  (01/19/2024)   Received from [facility]   Hunger Vital Sign    Within the past 12 months, you worried that your food would run out before you got money to buy more: Never true    Within the past 12 months, the food you bought just didn't last and you didn't have money to get more. : Never true  Transportation Needs: No Transportation Needs (01/19/2024)   Received from [facility]    In the past 12 months, has lack of reliable transportation kept you from medical appointments, meetings, work or from getting things needed for daily living? : No  Physical Activity: Not on file  Stress: Not on file  Social Connections: Not on file  Intimate Partner Violence: Not At Risk (03/08/2022)   Humiliation, Afraid, Rape, and Kick questionnaire    Fear of Current or Ex-Partner: No    Emotionally Abused: No    Physically Abused: No    Sexually Abused: No    Review of Systems: See HPI, otherwise negative ROS  Physical Exam: There were no vitals taken for this visit. General:   Alert,  pleasant and cooperative in NAD Head:  Normocephalic and atraumatic. Neck:  Supple; no masses or thyromegaly. Lungs:  Clear throughout to auscultation.    Heart:  Regular rate and rhythm. Abdomen:  Soft, nontender and nondistended. Normal bowel sounds, without guarding, and without rebound.   Neurologic:  Alert and  oriented x4;  grossly normal neurologically.  Impression/Plan: Jeanette Sandoval is here for an ERCP to be performed for CBD stone  Risks, benefits, limitations, and alternatives regarding  ERCP have been reviewed with the patient.  Questions have been answered.  All parties agreeable.   Rogelia Copping, MD  04/18/2024, 9:15 AM

## 2024-05-22 ENCOUNTER — Ambulatory Visit: Admitting: Physician Assistant

## 2024-06-27 ENCOUNTER — Ambulatory Visit: Admitting: Physician Assistant

## 2024-07-20 ENCOUNTER — Ambulatory Visit: Admitting: Physician Assistant
